# Patient Record
Sex: Female | Born: 1960 | Race: White | Hispanic: No | Marital: Married | Smoking: Never smoker
Health system: Southern US, Community
[De-identification: ages and names within clinical notes are randomized; demographics above are authoritative.]

## PROBLEM LIST (undated history)

## (undated) DIAGNOSIS — Z9889 Other specified postprocedural states: Secondary | ICD-10-CM

## (undated) DIAGNOSIS — R112 Nausea with vomiting, unspecified: Secondary | ICD-10-CM

## (undated) DIAGNOSIS — T4145XA Adverse effect of unspecified anesthetic, initial encounter: Secondary | ICD-10-CM

## (undated) DIAGNOSIS — T8859XA Other complications of anesthesia, initial encounter: Secondary | ICD-10-CM

## (undated) DIAGNOSIS — I10 Essential (primary) hypertension: Secondary | ICD-10-CM

## (undated) DIAGNOSIS — C801 Malignant (primary) neoplasm, unspecified: Secondary | ICD-10-CM

## (undated) DIAGNOSIS — Z803 Family history of malignant neoplasm of breast: Secondary | ICD-10-CM

## (undated) HISTORY — PX: HERNIA REPAIR: SHX51

## (undated) HISTORY — PX: OTHER SURGICAL HISTORY: SHX169

## (undated) HISTORY — PX: BREAST BIOPSY: SHX20

## (undated) HISTORY — PX: ROTATOR CUFF REPAIR: SHX139

## (undated) HISTORY — DX: Family history of malignant neoplasm of breast: Z80.3

---

## 1998-05-11 ENCOUNTER — Other Ambulatory Visit: Admission: RE | Admit: 1998-05-11 | Discharge: 1998-05-11 | Payer: Self-pay | Admitting: Obstetrics & Gynecology

## 1999-06-02 ENCOUNTER — Other Ambulatory Visit: Admission: RE | Admit: 1999-06-02 | Discharge: 1999-06-02 | Payer: Self-pay | Admitting: Obstetrics & Gynecology

## 1999-12-06 ENCOUNTER — Ambulatory Visit (HOSPITAL_COMMUNITY): Admission: RE | Admit: 1999-12-06 | Discharge: 1999-12-06 | Payer: Self-pay | Admitting: Obstetrics & Gynecology

## 1999-12-06 ENCOUNTER — Encounter: Payer: Self-pay | Admitting: Obstetrics & Gynecology

## 1999-12-08 ENCOUNTER — Encounter: Payer: Self-pay | Admitting: Obstetrics & Gynecology

## 1999-12-08 ENCOUNTER — Encounter: Admission: RE | Admit: 1999-12-08 | Discharge: 1999-12-08 | Payer: Self-pay | Admitting: Obstetrics & Gynecology

## 2000-06-03 ENCOUNTER — Other Ambulatory Visit: Admission: RE | Admit: 2000-06-03 | Discharge: 2000-06-03 | Payer: Self-pay | Admitting: Obstetrics & Gynecology

## 2000-12-09 ENCOUNTER — Encounter: Admission: RE | Admit: 2000-12-09 | Discharge: 2000-12-09 | Payer: Self-pay | Admitting: Family Medicine

## 2000-12-09 ENCOUNTER — Encounter: Payer: Self-pay | Admitting: Obstetrics & Gynecology

## 2001-06-12 ENCOUNTER — Other Ambulatory Visit: Admission: RE | Admit: 2001-06-12 | Discharge: 2001-06-12 | Payer: Self-pay | Admitting: Obstetrics & Gynecology

## 2001-12-10 ENCOUNTER — Encounter: Payer: Self-pay | Admitting: Obstetrics & Gynecology

## 2001-12-10 ENCOUNTER — Encounter: Admission: RE | Admit: 2001-12-10 | Discharge: 2001-12-10 | Payer: Self-pay | Admitting: Obstetrics & Gynecology

## 2002-07-10 ENCOUNTER — Other Ambulatory Visit: Admission: RE | Admit: 2002-07-10 | Discharge: 2002-07-10 | Payer: Self-pay | Admitting: Obstetrics & Gynecology

## 2002-12-21 ENCOUNTER — Encounter: Admission: RE | Admit: 2002-12-21 | Discharge: 2002-12-21 | Payer: Self-pay | Admitting: Obstetrics & Gynecology

## 2003-08-04 ENCOUNTER — Other Ambulatory Visit: Admission: RE | Admit: 2003-08-04 | Discharge: 2003-08-04 | Payer: Self-pay | Admitting: Obstetrics & Gynecology

## 2004-01-20 ENCOUNTER — Encounter: Admission: RE | Admit: 2004-01-20 | Discharge: 2004-01-20 | Payer: Self-pay | Admitting: Obstetrics & Gynecology

## 2004-01-28 ENCOUNTER — Encounter: Admission: RE | Admit: 2004-01-28 | Discharge: 2004-01-28 | Payer: Self-pay | Admitting: Obstetrics & Gynecology

## 2004-01-31 ENCOUNTER — Encounter (INDEPENDENT_AMBULATORY_CARE_PROVIDER_SITE_OTHER): Payer: Self-pay | Admitting: *Deleted

## 2004-01-31 ENCOUNTER — Encounter: Admission: RE | Admit: 2004-01-31 | Discharge: 2004-01-31 | Payer: Self-pay | Admitting: Obstetrics & Gynecology

## 2004-08-17 ENCOUNTER — Encounter: Admission: RE | Admit: 2004-08-17 | Discharge: 2004-08-17 | Payer: Self-pay | Admitting: Obstetrics & Gynecology

## 2004-09-19 ENCOUNTER — Other Ambulatory Visit: Admission: RE | Admit: 2004-09-19 | Discharge: 2004-09-19 | Payer: Self-pay | Admitting: Obstetrics & Gynecology

## 2005-03-07 ENCOUNTER — Encounter: Admission: RE | Admit: 2005-03-07 | Discharge: 2005-03-07 | Payer: Self-pay | Admitting: Obstetrics & Gynecology

## 2006-03-27 ENCOUNTER — Encounter: Admission: RE | Admit: 2006-03-27 | Discharge: 2006-03-27 | Payer: Self-pay | Admitting: Obstetrics & Gynecology

## 2007-04-08 ENCOUNTER — Encounter: Admission: RE | Admit: 2007-04-08 | Discharge: 2007-04-08 | Payer: Self-pay | Admitting: Obstetrics & Gynecology

## 2008-04-08 ENCOUNTER — Encounter: Admission: RE | Admit: 2008-04-08 | Discharge: 2008-04-08 | Payer: Self-pay | Admitting: Obstetrics & Gynecology

## 2008-06-16 ENCOUNTER — Ambulatory Visit: Payer: Self-pay | Admitting: Family Medicine

## 2009-04-12 ENCOUNTER — Encounter: Admission: RE | Admit: 2009-04-12 | Discharge: 2009-04-12 | Payer: Self-pay | Admitting: Obstetrics & Gynecology

## 2009-04-19 ENCOUNTER — Encounter: Admission: RE | Admit: 2009-04-19 | Discharge: 2009-04-19 | Payer: Self-pay | Admitting: Obstetrics & Gynecology

## 2010-03-05 ENCOUNTER — Encounter: Payer: Self-pay | Admitting: Obstetrics & Gynecology

## 2010-05-01 ENCOUNTER — Other Ambulatory Visit: Payer: Self-pay | Admitting: Obstetrics & Gynecology

## 2010-05-01 DIAGNOSIS — Z1231 Encounter for screening mammogram for malignant neoplasm of breast: Secondary | ICD-10-CM

## 2010-05-09 ENCOUNTER — Ambulatory Visit
Admission: RE | Admit: 2010-05-09 | Discharge: 2010-05-09 | Disposition: A | Discharge status: Home / Self Care | Payer: BC Managed Care – PPO | Source: Ambulatory Visit | Attending: Obstetrics & Gynecology | Admitting: Obstetrics & Gynecology

## 2010-05-09 DIAGNOSIS — Z1231 Encounter for screening mammogram for malignant neoplasm of breast: Secondary | ICD-10-CM

## 2011-04-30 ENCOUNTER — Other Ambulatory Visit: Payer: Self-pay | Admitting: Obstetrics & Gynecology

## 2011-04-30 DIAGNOSIS — Z1231 Encounter for screening mammogram for malignant neoplasm of breast: Secondary | ICD-10-CM

## 2011-05-21 ENCOUNTER — Ambulatory Visit: Payer: BC Managed Care – PPO

## 2011-07-06 ENCOUNTER — Ambulatory Visit
Admission: RE | Admit: 2011-07-06 | Discharge: 2011-07-06 | Disposition: A | Discharge status: Home / Self Care | Payer: BC Managed Care – PPO | Source: Ambulatory Visit | Attending: Obstetrics & Gynecology | Admitting: Obstetrics & Gynecology

## 2011-07-06 DIAGNOSIS — Z1231 Encounter for screening mammogram for malignant neoplasm of breast: Secondary | ICD-10-CM

## 2011-07-16 ENCOUNTER — Other Ambulatory Visit: Payer: Self-pay | Admitting: Obstetrics & Gynecology

## 2011-07-16 DIAGNOSIS — R928 Other abnormal and inconclusive findings on diagnostic imaging of breast: Secondary | ICD-10-CM

## 2011-07-23 ENCOUNTER — Ambulatory Visit
Admission: RE | Admit: 2011-07-23 | Discharge: 2011-07-23 | Disposition: A | Discharge status: Home / Self Care | Payer: BC Managed Care – PPO | Source: Ambulatory Visit | Attending: Obstetrics & Gynecology | Admitting: Obstetrics & Gynecology

## 2011-07-23 DIAGNOSIS — R928 Other abnormal and inconclusive findings on diagnostic imaging of breast: Secondary | ICD-10-CM

## 2011-07-24 ENCOUNTER — Other Ambulatory Visit: Payer: BC Managed Care – PPO

## 2011-11-27 ENCOUNTER — Other Ambulatory Visit: Payer: Self-pay | Admitting: General Surgery

## 2011-11-27 DIAGNOSIS — M25539 Pain in unspecified wrist: Secondary | ICD-10-CM

## 2011-11-29 ENCOUNTER — Ambulatory Visit
Admission: RE | Admit: 2011-11-29 | Discharge: 2011-11-29 | Disposition: A | Discharge status: Home / Self Care | Payer: BC Managed Care – PPO | Source: Ambulatory Visit | Attending: General Surgery | Admitting: General Surgery

## 2011-11-29 DIAGNOSIS — M25539 Pain in unspecified wrist: Secondary | ICD-10-CM

## 2012-07-17 ENCOUNTER — Other Ambulatory Visit: Payer: Self-pay

## 2012-07-17 DIAGNOSIS — Z1231 Encounter for screening mammogram for malignant neoplasm of breast: Secondary | ICD-10-CM

## 2012-09-01 ENCOUNTER — Ambulatory Visit
Admission: RE | Admit: 2012-09-01 | Discharge: 2012-09-01 | Disposition: A | Discharge status: Home / Self Care | Payer: BC Managed Care – PPO | Source: Ambulatory Visit

## 2012-09-01 DIAGNOSIS — Z1231 Encounter for screening mammogram for malignant neoplasm of breast: Secondary | ICD-10-CM

## 2012-09-04 ENCOUNTER — Other Ambulatory Visit: Payer: Self-pay | Admitting: Obstetrics & Gynecology

## 2012-09-04 DIAGNOSIS — Z803 Family history of malignant neoplasm of breast: Secondary | ICD-10-CM

## 2013-08-04 ENCOUNTER — Other Ambulatory Visit: Payer: Self-pay

## 2013-08-04 DIAGNOSIS — Z1231 Encounter for screening mammogram for malignant neoplasm of breast: Secondary | ICD-10-CM

## 2013-09-07 ENCOUNTER — Encounter (INDEPENDENT_AMBULATORY_CARE_PROVIDER_SITE_OTHER): Payer: Self-pay

## 2013-09-07 ENCOUNTER — Ambulatory Visit
Admission: RE | Admit: 2013-09-07 | Discharge: 2013-09-07 | Disposition: A | Discharge status: Home / Self Care | Payer: BC Managed Care – PPO | Source: Ambulatory Visit

## 2013-09-07 DIAGNOSIS — Z1231 Encounter for screening mammogram for malignant neoplasm of breast: Secondary | ICD-10-CM

## 2013-09-08 ENCOUNTER — Other Ambulatory Visit: Payer: Self-pay | Admitting: Obstetrics & Gynecology

## 2013-09-08 DIAGNOSIS — R928 Other abnormal and inconclusive findings on diagnostic imaging of breast: Secondary | ICD-10-CM

## 2013-09-14 ENCOUNTER — Ambulatory Visit
Admission: RE | Admit: 2013-09-14 | Discharge: 2013-09-14 | Disposition: A | Discharge status: Home / Self Care | Payer: BC Managed Care – PPO | Source: Ambulatory Visit | Attending: Obstetrics & Gynecology | Admitting: Obstetrics & Gynecology

## 2013-09-14 DIAGNOSIS — R928 Other abnormal and inconclusive findings on diagnostic imaging of breast: Secondary | ICD-10-CM

## 2014-07-28 ENCOUNTER — Other Ambulatory Visit: Payer: Self-pay | Admitting: Obstetrics & Gynecology

## 2014-07-29 LAB — CYTOLOGY - PAP

## 2014-09-13 ENCOUNTER — Other Ambulatory Visit: Payer: Self-pay

## 2014-09-13 DIAGNOSIS — Z1231 Encounter for screening mammogram for malignant neoplasm of breast: Secondary | ICD-10-CM

## 2014-10-20 ENCOUNTER — Ambulatory Visit: Payer: Self-pay

## 2014-10-26 ENCOUNTER — Ambulatory Visit: Payer: Self-pay

## 2014-11-01 ENCOUNTER — Ambulatory Visit
Admission: RE | Admit: 2014-11-01 | Discharge: 2014-11-01 | Disposition: A | Discharge status: Home / Self Care | Payer: BLUE CROSS/BLUE SHIELD | Source: Ambulatory Visit

## 2014-11-01 DIAGNOSIS — Z1231 Encounter for screening mammogram for malignant neoplasm of breast: Secondary | ICD-10-CM

## 2015-11-14 ENCOUNTER — Other Ambulatory Visit: Payer: Self-pay | Admitting: Obstetrics & Gynecology

## 2015-11-14 DIAGNOSIS — Z1231 Encounter for screening mammogram for malignant neoplasm of breast: Secondary | ICD-10-CM

## 2015-11-17 ENCOUNTER — Ambulatory Visit
Admission: RE | Admit: 2015-11-17 | Discharge: 2015-11-17 | Disposition: A | Discharge status: Home / Self Care | Payer: BLUE CROSS/BLUE SHIELD | Source: Ambulatory Visit | Attending: Obstetrics & Gynecology | Admitting: Obstetrics & Gynecology

## 2015-11-17 DIAGNOSIS — Z1231 Encounter for screening mammogram for malignant neoplasm of breast: Secondary | ICD-10-CM

## 2016-11-19 ENCOUNTER — Other Ambulatory Visit: Payer: Self-pay | Admitting: Obstetrics & Gynecology

## 2016-11-19 DIAGNOSIS — Z1231 Encounter for screening mammogram for malignant neoplasm of breast: Secondary | ICD-10-CM

## 2016-12-07 ENCOUNTER — Ambulatory Visit
Admission: RE | Admit: 2016-12-07 | Discharge: 2016-12-07 | Disposition: A | Discharge status: Home / Self Care | Payer: 59 | Source: Ambulatory Visit | Attending: Obstetrics & Gynecology | Admitting: Obstetrics & Gynecology

## 2016-12-07 DIAGNOSIS — Z1231 Encounter for screening mammogram for malignant neoplasm of breast: Secondary | ICD-10-CM

## 2017-04-15 ENCOUNTER — Other Ambulatory Visit: Payer: Self-pay | Admitting: Otolaryngology

## 2017-04-15 DIAGNOSIS — H8102 Meniere's disease, left ear: Secondary | ICD-10-CM

## 2017-04-15 DIAGNOSIS — H9313 Tinnitus, bilateral: Secondary | ICD-10-CM

## 2017-04-15 DIAGNOSIS — H9122 Sudden idiopathic hearing loss, left ear: Secondary | ICD-10-CM

## 2017-04-23 ENCOUNTER — Ambulatory Visit
Admission: RE | Admit: 2017-04-23 | Discharge: 2017-04-23 | Disposition: A | Discharge status: Home / Self Care | Payer: 59 | Source: Ambulatory Visit | Attending: Otolaryngology | Admitting: Otolaryngology

## 2017-04-23 DIAGNOSIS — H9313 Tinnitus, bilateral: Secondary | ICD-10-CM

## 2017-04-23 DIAGNOSIS — H9122 Sudden idiopathic hearing loss, left ear: Secondary | ICD-10-CM

## 2017-04-23 DIAGNOSIS — H8102 Meniere's disease, left ear: Secondary | ICD-10-CM

## 2017-04-23 MED ORDER — GADOBENATE DIMEGLUMINE 529 MG/ML IV SOLN
10.0000 mL | Freq: Once | INTRAVENOUS | Status: AC | PRN
  Administered 2017-04-23: 10 mL via INTRAVENOUS

## 2017-12-02 ENCOUNTER — Other Ambulatory Visit: Payer: Self-pay | Admitting: Obstetrics & Gynecology

## 2017-12-02 DIAGNOSIS — Z1231 Encounter for screening mammogram for malignant neoplasm of breast: Secondary | ICD-10-CM

## 2018-01-13 ENCOUNTER — Ambulatory Visit
Admission: RE | Admit: 2018-01-13 | Discharge: 2018-01-13 | Disposition: A | Discharge status: Home / Self Care | Payer: 59 | Source: Ambulatory Visit | Attending: Obstetrics & Gynecology | Admitting: Obstetrics & Gynecology

## 2018-01-13 DIAGNOSIS — Z1231 Encounter for screening mammogram for malignant neoplasm of breast: Secondary | ICD-10-CM

## 2018-05-02 ENCOUNTER — Telehealth: Payer: Self-pay

## 2018-05-02 ENCOUNTER — Inpatient Hospital Stay: Payer: 59 | Attending: Gynecologic Oncology

## 2018-05-02 ENCOUNTER — Inpatient Hospital Stay (HOSPITAL_BASED_OUTPATIENT_CLINIC_OR_DEPARTMENT_OTHER): Payer: 59 | Admitting: Gynecologic Oncology

## 2018-05-02 ENCOUNTER — Other Ambulatory Visit: Payer: Self-pay

## 2018-05-02 ENCOUNTER — Encounter: Payer: Self-pay | Admitting: Gynecologic Oncology

## 2018-05-02 VITALS — BP 148/88 | HR 100 | Resp 20 | Ht 62.5 in | Wt 124.0 lb

## 2018-05-02 DIAGNOSIS — D3912 Neoplasm of uncertain behavior of left ovary: Secondary | ICD-10-CM | POA: Insufficient documentation

## 2018-05-02 DIAGNOSIS — I10 Essential (primary) hypertension: Secondary | ICD-10-CM | POA: Insufficient documentation

## 2018-05-02 DIAGNOSIS — Z79899 Other long term (current) drug therapy: Secondary | ICD-10-CM | POA: Diagnosis not present

## 2018-05-02 DIAGNOSIS — R971 Elevated cancer antigen 125 [CA 125]: Secondary | ICD-10-CM

## 2018-05-02 DIAGNOSIS — N838 Other noninflammatory disorders of ovary, fallopian tube and broad ligament: Secondary | ICD-10-CM

## 2018-05-02 LAB — BASIC METABOLIC PANEL
Anion gap: 13 (ref 5–15)
BUN: 9 mg/dL (ref 6–20)
CHLORIDE: 96 mmol/L — AB (ref 98–111)
CO2: 24 mmol/L (ref 22–32)
CREATININE: 0.83 mg/dL (ref 0.44–1.00)
Calcium: 9.4 mg/dL (ref 8.9–10.3)
GFR calc Af Amer: >60 mL/min (ref >60)
GFR calc non Af Amer: >60 mL/min (ref >60)
GLUCOSE: 88 mg/dL (ref 70–99)
POTASSIUM: 4 mmol/L (ref 3.5–5.1)
Sodium: 133 mmol/L — ABNORMAL LOW (ref 135–145)

## 2018-05-02 NOTE — Telephone Encounter (Signed)
LM for patient stating that her sodium level was slightly low at 133.  She can take in extra sodium with table salt and or eat canned soups. Her kidney function is normal but edging up toward the high normal; range.  This maybe showing that she is a little dehydrated.  Increase non caffinated  fluids to 64 ounces a day.   This is a good amount for staying hydrated daily. Pt can call on 05-05-18 if she has any questions or concerns.

## 2018-05-02 NOTE — Patient Instructions (Addendum)
Plan to have a CT scan of the abdomen and pelvis and we will contact you with the results.   You will be contacted with a surgical date when it is considered safe to perform surgeries. We will also arrange for you to meet with Joylene John, NP for a pre-op appointment prior to surgery to discuss instructions in detail.                 Preparing for your Surgery  Plan for surgery with Dr. Everitt Amber at [facility] will be scheduled for a robotic assisted unilateral salpingo-oophorectomy, possible staging, possible total hysterectomy.   Pre-operative Testing -You will receive a phone call from presurgical testing at [facility] to arrange for a pre-operative testing appointment before your surgery.  This appointment normally occurs one to two weeks before your scheduled surgery.   -Bring your insurance card, copy of an advanced directive if applicable, medication list  -At that visit, you will be asked to sign a consent for a possible blood transfusion in case a transfusion becomes necessary during surgery.  The need for a blood transfusion is rare but having consent is a necessary part of your care.     -You should not be taking blood thinners or aspirin at least ten days prior to surgery unless instructed by your surgeon.  Day Before Surgery at [place] [facility] will be asked to take in a light diet the day before surgery.  Avoid carbonated beverages.  You will be advised to have nothing to eat or drink after midnight the evening before.    Eat a light diet the day before surgery.  Examples including soups, broths, toast, yogurt, mashed potatoes.  Things to avoid include carbonated beverages (fizzy beverages), raw fruits and raw vegetables, or beans.   If your bowels are filled with gas, your surgeon will have difficulty visualizing your pelvic organs which increases your surgical risks.  Your role in recovery Your role is to become active as soon as directed by your doctor,  while still giving yourself time to heal.  Rest when you feel tired. You will be asked to do the following in order to speed your recovery:  - Cough and breathe deeply. This helps toclear and expand your lungs and can prevent pneumonia. You may be given a spirometer to practice deep breathing. A staff member will show you how to use the spirometer. - Do mild physical activity. Walking or moving your legs help your circulation and body functions return to normal. A staff member will help you when you try to walk and will provide you with simple exercises. Do not try to get up or walk alone the first time. - Actively manage your pain. Managing your pain lets you move in comfort. We will ask you to rate your pain on a scale of zero to 10. It is your responsibility to tell your doctor or nurse where and how much you hurt so your pain can be treated.  Special Considerations -If you are diabetic, you may be placed on insulin after surgery to have closer control over your blood sugars to promote healing and recovery.  This does not mean that you will be discharged on insulin.  If applicable, your oral antidiabetics will be resumed when you are tolerating a solid diet.  -Your final pathology results from surgery should be available around one week after surgery and the results will be relayed to you when available.  -Dr. Lahoma Crocker  is the Surgeon that assists your GYN Oncologist with surgery.    -FMLA forms can be faxed to [phone] and please allow 5-7 business days for completion.   Blood Transfusion Information WHAT IS A BLOOD TRANSFUSION? A transfusion is the replacement of blood or some of its parts. Blood is made up of multiple cells which provide different functions.  Red blood cells carry oxygen and are used for blood loss replacement.  White blood cells fight against infection.  Platelets control bleeding.  Plasma helps clot blood.  Other blood products are available for  specialized needs, such as hemophilia or other clotting disorders. BEFORE THE TRANSFUSION  Who gives blood for transfusions?   You may be able to donate blood to be used at a later date on yourself (autologous donation).  Relatives can be asked to donate blood. This is generally not any safer than if you have received blood from a stranger. The same precautions are taken to ensure safety when a relative's blood is donated.  Healthy volunteers who are fully evaluated to make sure their blood is safe. This is blood bank blood. Transfusion therapy is the safest it has ever been in the practice of medicine. Before blood is taken from a donor, a complete history is taken to make sure that person has no history of diseases nor engages in risky social behavior (examples are intravenous drug use or sexual activity with multiple partners). The donor's travel history is screened to minimize risk of transmitting infections, such as malaria. The donated blood is tested for signs of infectious diseases, such as HIV and hepatitis. The blood is then tested to be sure it is compatible with you in order to minimize the chance of a transfusion reaction. If you or a relative donates blood, this is often done in anticipation of surgery and is not appropriate for emergency situations. It takes many days to process the donated blood. RISKS AND COMPLICATIONS Although transfusion therapy is very safe and saves many lives, the main dangers of transfusion include:   Getting an infectious disease.  Developing a transfusion reaction. This is an allergic reaction to something in the blood you were given. Every precaution is taken to prevent this. The decision to have a blood transfusion has been considered carefully by your caregiver before blood is given. Blood is not given unless the benefits outweigh the risks.

## 2018-05-02 NOTE — Progress Notes (Signed)
Consult Note: Gyn-Onc  Consult was requested by Dr. Nori Riis for the evaluation of Sara Werner 58 y.o. female  CC:  Chief Complaint  Patient presents with  . Ovarian mass, left    Assessment/Plan:  Sara Werner  is a 58 y.o.  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  On physical examination findings are reassuring, however I certainly do feel a large cystic mass filling the cul-de-sac.  The patient is relatively asymptomatic.  We will first begin by better evaluating the mass in the upper abdomen with a CT abdomen and pelvis.  Once this is been performed we will plan on scheduling the patient for surgery at the appropriate timing interval pending resource availability.  I explained that surgery will involve a robotically assisted unilateral salpingo-oophorectomy possible BSO hysterectomy and staging pending frozen section results.  I discussed anticipated recovery, hospital stay, and risks including  bleeding, infection, damage to internal organs (such as bladder,ureters, bowels), blood clot, reoperation and rehospitalization.   HPI: Sara Werner is a 58 year old woman who is seen in consultation at the request of Dr Nori Riis for a left ovarian mass and elevated CA 125.  The patient had an episode of straining to have a bowel movement approximately 1 week ago in March 2020.  She then felt some pelvic pressure.  She returned to [facility] and reported this to her OB/GYN GYN who performed a transvaginal ultrasound scan on May 01, 2018.  This revealed a uterus measuring 8 x 4 x 5 cm with an endometrium of 6 mm.  The right ovary was not visualized.  The left adnexa revealed a 10.6 x 6.5 x 9.8 cm complex mass seen.  It was partly cystic and partly solid.  There is some blood flow seen within the solid areas.  No normal ovarian tissue was seen.  A Ca1 25 was drawn on May 02, 2018 and this was mildly elevated at 56.1 (upper limit of normal 38.1).  Patient is  an otherwise very healthy woman.  Her she is never had abdominal surgeries.  She has had a history of 2 prior vaginal deliveries.  She has no family history for breast ovarian or colonic cancer.  She is a non-smoker.  Current Meds:  Outpatient Encounter Medications as of 05/02/2018  Medication Sig  . chlorthalidone (HYGROTON) 25 MG tablet Take 25 mg by mouth daily.  Marland Kitchen ESTROGEL 0.75 MG/1.25 GM (0.06%) topical gel APP 2 PUMPS TOPICALLY AA D  . lisdexamfetamine (VYVANSE) 70 MG capsule Take 70 mg by mouth daily.  Marland Kitchen lisinopril-hydrochlorothiazide (PRINZIDE,ZESTORETIC) 20-12.5 MG tablet TK 1 T PO D  . progesterone (PROMETRIUM) 100 MG capsule Take 100 mg by mouth at bedtime.   No facility-administered encounter medications on file as of 05/02/2018.     Allergy: No Known Allergies  Social Hx:   Social History   Socioeconomic History  . Marital status: Married    Spouse name: Not on file  . Number of children: Not on file  . Years of education: Not on file  . Highest education level: Not on file  Occupational History  . Not on file  Social Needs  . Financial resource strain: Not on file  . Food insecurity:    Worry: Not on file    Inability: Not on file  . Transportation needs:    Medical: Not on file    Non-medical: Not on file  Tobacco Use  . Smoking status:  Never Smoker  . Smokeless tobacco: Never Used  Substance and Sexual Activity  . Alcohol use: Yes    Comment: Daily drinks  . Drug use: Never  . Sexual activity: Not Currently  Lifestyle  . Physical activity:    Days per week: Not on file    Minutes per session: Not on file  . Stress: Not on file  Relationships  . Social connections:    Talks on phone: Not on file    Gets together: Not on file    Attends religious service: Not on file    Active member of club or organization: Not on file    Attends meetings of clubs or organizations: Not on file    Relationship status: Not on file  . Intimate partner violence:     Fear of current or ex partner: Not on file    Emotionally abused: Not on file    Physically abused: Not on file    Forced sexual activity: Not on file  Other Topics Concern  . Not on file  Social History Narrative  . Not on file    Past Surgical Hx:  Past Surgical History:  Procedure Laterality Date  . BREAST BIOPSY Right    negative    Past Medical Hx: History reviewed. No pertinent past medical history.  Past Gynecological History:  SVD x 2 Patient's last menstrual period was 04/23/2010.  Family Hx:  Family History  Problem Relation Age of Onset  . Breast cancer Mother   . Alzheimer's disease Mother     Review of Systems:  Constitutional  Feels well,    ENT Normal appearing ears and nares bilaterally Skin/Breast  No rash, sores, jaundice, itching, dryness Cardiovascular  No chest pain, shortness of breath, or edema  Pulmonary  No cough or wheeze.  Gastro Intestinal  No nausea, vomitting, or diarrhoea. No bright red blood per rectum, no abdominal pain, change in bowel movement, or constipation.  Genito Urinary  No frequency, urgency, dysuria, no bleeding, + pelvic pressure Musculo Skeletal  No myalgia, arthralgia, joint swelling or pain  Neurologic  No weakness, numbness, change in gait,  Psychology  No depression, anxiety, insomnia.   Vitals:  Blood pressure (!) 148/88, pulse 100, resp. rate 20, height 5' 2.5" (1.588 m), weight 124 lb (56.2 kg), last menstrual period 04/23/2010, SpO2 100 %.  Physical Exam: WD in NAD Neck  Supple NROM, without any enlargements.  Lymph Node Survey No cervical supraclavicular or inguinal adenopathy Cardiovascular  Pulse normal rate, regularity and rhythm. S1 and S2 normal.  Lungs  Clear to auscultation bilateraly, without wheezes/crackles/rhonchi. Good air movement.  Skin  No rash/lesions/breakdown  Psychiatry  Alert and oriented to person, place, and time  Abdomen  Normoactive bowel sounds, abdomen soft, non-tender  and thin without evidence of hernia.  Back No CVA tenderness Genito Urinary  Vulva/vagina: Normal external female genitalia.  No lesions. No discharge or bleeding.  Bladder/urethra:  No lesions or masses, well supported bladder  Vagina: normal  Cervix: Normal appearing, no lesions.  Uterus:  Small, mobile, no parametrial involvement or nodularity.  Adnexa: fullness in the left adnexa and smooth cystic mass in cul de sac.   Rectal  Good tone,  no cul de sac nodularity.  Extremities  No bilateral cyanosis, clubbing or edema.   Thereasa Solo, MD  05/02/2018, 5:08 PM

## 2018-05-02 NOTE — H&P (View-Only) (Signed)
Consult Note: Gyn-Onc  Consult was requested by Dr. Nori Riis for the evaluation of Sara Werner 58 y.o. female  CC:  Chief Complaint  Patient presents with  . Ovarian mass, left    Assessment/Plan:  [name]  is a 58 y.o.  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  On physical examination findings are reassuring, however I certainly do feel a large cystic mass filling the cul-de-sac.  The patient is relatively asymptomatic.  We will first begin by better evaluating the mass in the upper abdomen with a CT abdomen and pelvis.  Once this is been performed we will plan on scheduling the patient for surgery at the appropriate timing interval pending resource availability.  I explained that surgery will involve a robotically assisted unilateral salpingo-oophorectomy possible BSO hysterectomy and staging pending frozen section results.  I discussed anticipated recovery, hospital stay, and risks including  bleeding, infection, damage to internal organs (such as bladder,ureters, bowels), blood clot, reoperation and rehospitalization.   HPI: [name] is a 58 year old woman who is seen in consultation at the request of Dr Nori Riis for a left ovarian mass and elevated CA 125.  The patient had an episode of straining to have a bowel movement approximately 1 week ago in March 2020.  She then felt some pelvic pressure.  She returned to [facility] and reported this to her OB/GYN GYN who performed a transvaginal ultrasound scan on May 01, 2018.  This revealed a uterus measuring 8 x 4 x 5 cm with an endometrium of 6 mm.  The right ovary was not visualized.  The left adnexa revealed a 10.6 x 6.5 x 9.8 cm complex mass seen.  It was partly cystic and partly solid.  There is some blood flow seen within the solid areas.  No normal ovarian tissue was seen.  A Ca1 25 was drawn on May 02, 2018 and this was mildly elevated at 56.1 (upper limit of normal 38.1).  Patient is  an otherwise very healthy woman.  Her she is never had abdominal surgeries.  She has had a history of 2 prior vaginal deliveries.  She has no family history for breast ovarian or colonic cancer.  She is a non-smoker.  Current Meds:  Outpatient Encounter Medications as of 05/02/2018  Medication Sig  . chlorthalidone (HYGROTON) 25 MG tablet Take 25 mg by mouth daily.  Marland Kitchen ESTROGEL 0.75 MG/1.25 GM (0.06%) topical gel APP 2 PUMPS TOPICALLY AA D  . lisdexamfetamine (VYVANSE) 70 MG capsule Take 70 mg by mouth daily.  Marland Kitchen lisinopril-hydrochlorothiazide (PRINZIDE,ZESTORETIC) 20-12.5 MG tablet TK 1 T PO D  . progesterone (PROMETRIUM) 100 MG capsule Take 100 mg by mouth at bedtime.   No facility-administered encounter medications on file as of 05/02/2018.     Allergy: No Known Allergies  Social Hx:   Social History   Socioeconomic History  . Marital status: Married    Spouse name: Not on file  . Number of children: Not on file  . Years of education: Not on file  . Highest education level: Not on file  Occupational History  . Not on file  Social Needs  . Financial resource strain: Not on file  . Food insecurity:    Worry: Not on file    Inability: Not on file  . Transportation needs:    Medical: Not on file    Non-medical: Not on file  Tobacco Use  . Smoking status:  Never Smoker  . Smokeless tobacco: Never Used  Substance and Sexual Activity  . Alcohol use: Yes    Comment: Daily drinks  . Drug use: Never  . Sexual activity: Not Currently  Lifestyle  . Physical activity:    Days per week: Not on file    Minutes per session: Not on file  . Stress: Not on file  Relationships  . Social connections:    Talks on phone: Not on file    Gets together: Not on file    Attends religious service: Not on file    Active member of club or organization: Not on file    Attends meetings of clubs or organizations: Not on file    Relationship status: Not on file  . Intimate partner violence:     Fear of current or ex partner: Not on file    Emotionally abused: Not on file    Physically abused: Not on file    Forced sexual activity: Not on file  Other Topics Concern  . Not on file  Social History Narrative  . Not on file    Past Surgical Hx:  Past Surgical History:  Procedure Laterality Date  . BREAST BIOPSY Right    negative    Past Medical Hx: History reviewed. No pertinent past medical history.  Past Gynecological History:  SVD x 2 Patient's last menstrual period was 04/23/2010.  Family Hx:  Family History  Problem Relation Age of Onset  . Breast cancer Mother   . Alzheimer's disease Mother     Review of Systems:  Constitutional  Feels well,    ENT Normal appearing ears and nares bilaterally Skin/Breast  No rash, sores, jaundice, itching, dryness Cardiovascular  No chest pain, shortness of breath, or edema  Pulmonary  No cough or wheeze.  Gastro Intestinal  No nausea, vomitting, or diarrhoea. No bright red blood per rectum, no abdominal pain, change in bowel movement, or constipation.  Genito Urinary  No frequency, urgency, dysuria, no bleeding, + pelvic pressure Musculo Skeletal  No myalgia, arthralgia, joint swelling or pain  Neurologic  No weakness, numbness, change in gait,  Psychology  No depression, anxiety, insomnia.   Vitals:  Blood pressure (!) 148/88, pulse 100, resp. rate 20, height 5' 2.5" (1.588 m), weight 124 lb (56.2 kg), last menstrual period 04/23/2010, SpO2 100 %.  Physical Exam: WD in NAD Neck  Supple NROM, without any enlargements.  Lymph Node Survey No cervical supraclavicular or inguinal adenopathy Cardiovascular  Pulse normal rate, regularity and rhythm. S1 and S2 normal.  Lungs  Clear to auscultation bilateraly, without wheezes/crackles/rhonchi. Good air movement.  Skin  No rash/lesions/breakdown  Psychiatry  Alert and oriented to person, place, and time  Abdomen  Normoactive bowel sounds, abdomen soft, non-tender  and thin without evidence of hernia.  Back No CVA tenderness Genito Urinary  Vulva/vagina: Normal external female genitalia.  No lesions. No discharge or bleeding.  Bladder/urethra:  No lesions or masses, well supported bladder  Vagina: normal  Cervix: Normal appearing, no lesions.  Uterus:  Small, mobile, no parametrial involvement or nodularity.  Adnexa: fullness in the left adnexa and smooth cystic mass in cul de sac.   Rectal  Good tone,  no cul de sac nodularity.  Extremities  No bilateral cyanosis, clubbing or edema.   Thereasa Solo, MD  05/02/2018, 5:08 PM

## 2018-05-05 ENCOUNTER — Other Ambulatory Visit: Payer: Self-pay

## 2018-05-05 ENCOUNTER — Ambulatory Visit (HOSPITAL_COMMUNITY)
Admission: RE | Admit: 2018-05-05 | Discharge: 2018-05-05 | Disposition: A | Discharge status: Home / Self Care | Payer: 59 | Source: Ambulatory Visit | Attending: Gynecologic Oncology | Admitting: Gynecologic Oncology

## 2018-05-05 DIAGNOSIS — N838 Other noninflammatory disorders of ovary, fallopian tube and broad ligament: Secondary | ICD-10-CM | POA: Diagnosis present

## 2018-05-05 MED ORDER — IOHEXOL 300 MG/ML  SOLN
100.0000 mL | Freq: Once | INTRAMUSCULAR | Status: AC | PRN
  Administered 2018-05-05: 100 mL via INTRAVENOUS

## 2018-05-05 MED ORDER — SODIUM CHLORIDE (PF) 0.9 % IJ SOLN
INTRAMUSCULAR | Status: AC
  Filled 2018-05-05: qty 50

## 2018-05-06 ENCOUNTER — Telehealth: Payer: Self-pay | Admitting: *Deleted

## 2018-05-06 ENCOUNTER — Telehealth: Payer: Self-pay | Admitting: Gynecologic Oncology

## 2018-05-06 ENCOUNTER — Other Ambulatory Visit: Payer: Self-pay | Admitting: Gynecologic Oncology

## 2018-05-06 DIAGNOSIS — N838 Other noninflammatory disorders of ovary, fallopian tube and broad ligament: Secondary | ICD-10-CM

## 2018-05-06 NOTE — Telephone Encounter (Signed)
Patient's husband called stating "I wanted to thank Dr. Denman George for getting my wife's surgery scheduled so soon. Also to talk to about the CT results." Explained that Dr. Denman George will be in the office tomorrow. Husband can be reached at [phone].

## 2018-05-06 NOTE — Patient Instructions (Addendum)
KEELYN MONJARAS  05/06/2018   Your procedure is scheduled on: 05-08-18   Report to [facility] Main  Entrance    Report to [place] at 5:30 AM    Call this number if you have problems the morning of surgery [phone]    Eat a light diet the day before surgery.  Examples including soups, broths, toast, yogurt, mashed potatoes.  Things to avoid include carbonated beverages (fizzy beverages), raw fruits and raw vegetables, or beans.   If your bowels are filled with gas, your surgeon will have difficulty visualizing your pelvic organs which increases your surgical risks.   Remember: NO SOLID FOOD AFTER MIDNIGHT THE NIGHT PRIOR TO SURGERY. NOTHING BY MOUTH EXCEPT CLEAR LIQUIDS UNTIL 3 HOURS PRIOR TO [place] SURGERY. PLEASE FINISH ENSURE DRINK PER SURGEON ORDER 3 HOURS PRIOR TO SCHEDULED SURGERY TIME WHICH NEEDS TO BE COMPLETED AT 4:30 AM.    CLEAR LIQUID DIET   Foods Allowed                                                                     Foods Excluded  Coffee and tea, regular and decaf                             liquids that you cannot  Plain Jell-O in any flavor                                             see through such as: Fruit ices (not with fruit pulp)                                     milk, soups, orange juice  Iced Popsicles                                    All solid food Carbonated beverages, regular and diet                                    Cranberry, grape and apple juices Sports drinks like Gatorade Lightly seasoned clear broth or consume(fat free) Sugar, honey syrup  Sample Menu Breakfast                                Lunch                                     Supper Cranberry juice                    Beef broth  Chicken broth Jell-O                                     Grape juice                           Apple juice Coffee or tea                        Jell-O                                       Popsicle                                                Coffee or tea                        Coffee or tea  _____________________________________________________________________    BRUSH YOUR TEETH MORNING OF SURGERY AND RINSE YOUR MOUTH OUT, NO CHEWING GUM CANDY OR MINTS.     Take these medicines the morning of surgery with A SIP OF WATER: Cetirizine (Zyrtec)                                You may not have any metal on your body including hair pins and              piercings  Do not wear jewelry, make-up, lotions, powders or perfumes, deodorant             Do not wear nail polish.  Do not shave  48 hours prior to surgery.     Do not bring valuables to the hospital. [place].  Contacts, dentures or bridgework may not be worn into surgery.  Leave suitcase in the car. After surgery it may be brought to your room.     Patients discharged the day of surgery will not be allowed to drive home. IF YOU ARE HAVING SURGERY AND GOING HOME THE SAME DAY, YOU MUST HAVE AN ADULT TO DRIVE YOU HOME AND BE WITH YOU FOR 24 HOURS. YOU MAY GO HOME BY TAXI OR UBER OR ORTHERWISE, BUT AN ADULT MUST ACCOMPANY YOU HOME AND STAY WITH YOU FOR 24 HOURS.  Name and phone number of your driver:Dr. Lavaya Defreitas [phone]  Special Instructions: N/A              Please read over the following fact sheets you were given: _____________________________________________________________________  [facility] - Preparing for Surgery Before surgery, you can play an important role.  Because skin is not sterile, your skin needs to be as free of germs as possible.  You can reduce the number of germs on your skin by washing with CHG (chlorahexidine gluconate) soap before surgery.  CHG is an antiseptic cleaner which kills germs and bonds with the skin to continue killing germs even after washing. Please DO NOT use if you have an allergy to CHG or antibacterial soaps.  If your skin  becomes reddened/irritated stop using the CHG and inform your nurse when you arrive at Short Stay. Do not shave (including legs and underarms) for at least 48 hours prior to the first CHG shower.  You may shave your face/neck. Please follow these instructions carefully:  1.  Shower with CHG Soap the night before surgery and the  morning of Surgery.  2.  If you choose to wash your hair, wash your hair first as usual with your  normal  shampoo.  3.  After you shampoo, rinse your hair and body thoroughly to remove the  shampoo.                           4.  Use CHG as you would any other liquid soap.  You can apply chg directly  to the skin and wash                       Gently with a scrungie or clean washcloth.  5.  Apply the CHG Soap to your body ONLY FROM THE NECK DOWN.   Do not use on face/ open                           Wound or open sores. Avoid contact with eyes, ears mouth and genitals (private parts).                       Wash face,  Genitals (private parts) with your normal soap.             6.  Wash thoroughly, paying special attention to the area where your surgery  will be performed.  7.  Thoroughly rinse your body with warm water from the neck down.  8.  DO NOT shower/wash with your normal soap after using and rinsing off  the CHG Soap.                9.  Pat yourself dry with a clean towel.            10.  Wear clean pajamas.            11.  Place clean sheets on your bed the night of your first shower and do not  sleep with pets. Day of Surgery : Do not apply any lotions/deodorants the morning of surgery.  Please wear clean clothes to the hospital/surgery center.  FAILURE TO FOLLOW THESE INSTRUCTIONS MAY RESULT IN THE CANCELLATION OF YOUR SURGERY PATIENT SIGNATURE_________________________________  NURSE SIGNATURE__________________________________  ________________________________________________________________________   Adam Phenix  An incentive spirometer is a  tool that can help keep your lungs clear and active. This tool measures how well you are filling your lungs with each breath. Taking long deep breaths may help reverse or decrease the chance of developing breathing (pulmonary) problems (especially infection) following:  A long period of time when you are unable to move or be active. BEFORE THE PROCEDURE   If the spirometer includes an indicator to show your best effort, your nurse or respiratory therapist will set it to a desired goal.  If possible, sit up straight or lean slightly forward. Try not to slouch.  Hold the incentive spirometer in an upright position. INSTRUCTIONS FOR USE  1. Sit on the edge of your bed if possible, or sit up as far as you can in bed or on  a chair. 2. Hold the incentive spirometer in an upright position. 3. Breathe out normally. 4. Place the mouthpiece in your mouth and seal your lips tightly around it. 5. Breathe in slowly and as deeply as possible, raising the piston or the ball toward the top of the column. 6. Hold your breath for 3-5 seconds or for as long as possible. Allow the piston or ball to fall to the bottom of the column. 7. Remove the mouthpiece from your mouth and breathe out normally. 8. Rest for a few seconds and repeat Steps 1 through 7 at least 10 times every 1-2 hours when you are awake. Take your time and take a few normal breaths between deep breaths. 9. The spirometer may include an indicator to show your best effort. Use the indicator as a goal to work toward during each repetition. 10. After each set of 10 deep breaths, practice coughing to be sure your lungs are clear. If you have an incision (the cut made at the time of surgery), support your incision when coughing by placing a pillow or rolled up towels firmly against it. Once you are able to get out of bed, walk around indoors and cough well. You may stop using the incentive spirometer when instructed by your caregiver.  RISKS AND  COMPLICATIONS  Take your time so you do not get dizzy or light-headed.  If you are in pain, you may need to take or ask for pain medication before doing incentive spirometry. It is harder to take a deep breath if you are having pain. AFTER USE  Rest and breathe slowly and easily.  It can be helpful to keep track of a log of your progress. Your caregiver can provide you with a simple table to help with this. If you are using the spirometer at home, follow these instructions: [place] IF:   You are having difficultly using the spirometer.  You have trouble using the spirometer as often as instructed.  Your pain medication is not giving enough relief while using the spirometer.  You develop fever of 100.5 F (38.1 C) or higher. SEEK IMMEDIATE MEDICAL CARE IF:   You cough up bloody sputum that had not been present before.  You develop fever of 102 F (38.9 C) or greater.  You develop worsening pain at or near the incision site. MAKE SURE YOU:   Understand these instructions.  Will watch your condition.  Will get help right away if you are not doing well or get worse. Document Released: 06/11/2006 Document Revised: 04/23/2011 Document Reviewed: 08/12/2006 Exit[employer] Patient Information 2014 [employer], [place].   ________________________________________________________________________  WHAT IS A BLOOD TRANSFUSION? Blood Transfusion Information  A transfusion is the replacement of blood or some of its parts. Blood is made up of multiple cells which provide different functions.  Red blood cells carry oxygen and are used for blood loss replacement.  White blood cells fight against infection.  Platelets control bleeding.  Plasma helps clot blood.  Other blood products are available for specialized needs, such as hemophilia or other clotting disorders. BEFORE THE TRANSFUSION  Who gives blood for transfusions?   Healthy volunteers who are fully evaluated to make sure  their blood is safe. This is blood bank blood. Transfusion therapy is the safest it has ever been in the practice of medicine. Before blood is taken from a donor, a complete history is taken to make sure that person has no history of diseases nor engages in risky social behavior (  examples are intravenous drug use or sexual activity with multiple partners). The donor's travel history is screened to minimize risk of transmitting infections, such as malaria. The donated blood is tested for signs of infectious diseases, such as HIV and hepatitis. The blood is then tested to be sure it is compatible with you in order to minimize the chance of a transfusion reaction. If you or a relative donates blood, this is often done in anticipation of surgery and is not appropriate for emergency situations. It takes many days to process the donated blood. RISKS AND COMPLICATIONS Although transfusion therapy is very safe and saves many lives, the main dangers of transfusion include:   Getting an infectious disease.  Developing a transfusion reaction. This is an allergic reaction to something in the blood you were given. Every precaution is taken to prevent this. The decision to have a blood transfusion has been considered carefully by your caregiver before blood is given. Blood is not given unless the benefits outweigh the risks. AFTER THE TRANSFUSION  Right after receiving a blood transfusion, you will usually feel much better and more energetic. This is especially true if your red blood cells have gotten low (anemic). The transfusion raises the level of the red blood cells which carry oxygen, and this usually causes an energy increase.  The nurse administering the transfusion will monitor you carefully for complications. HOME CARE INSTRUCTIONS  No special instructions are needed after a transfusion. You may find your energy is better. Speak with your caregiver about any limitations on activity for underlying diseases  you may have. SEEK MEDICAL CARE IF:   Your condition is not improving after your transfusion.  You develop redness or irritation at the intravenous (IV) site. SEEK IMMEDIATE MEDICAL CARE IF:  Any of the following symptoms occur over the next 12 hours:  Shaking chills.  You have a temperature by mouth above 102 F (38.9 C), not controlled by medicine.  Chest, back, or muscle pain.  People around you feel you are not acting correctly or are confused.  Shortness of breath or difficulty breathing.  Dizziness and fainting.  You get a rash or develop hives.  You have a decrease in urine output.  Your urine turns a dark color or changes to pink, red, or brown. Any of the following symptoms occur over the next 10 days:  You have a temperature by mouth above 102 F (38.9 C), not controlled by medicine.  Shortness of breath.  Weakness after normal activity.  The white part of the eye turns yellow (jaundice).  You have a decrease in the amount of urine or are urinating less often.  Your urine turns a dark color or changes to pink, red, or brown. Document Released: 01/27/2000 Document Revised: 04/23/2011 Document Reviewed: 09/15/2007 [facility] Patient Information 2014 [employer], [place].  _______________________________________________________________________

## 2018-05-06 NOTE — Telephone Encounter (Signed)
Informed of CT scan findings of complex left adnexal mass with solid and cystic components concerning for carcinoma.  No evidence of extraovarian disease or lymphadenopathy.  Discussed that I am recommending robotic assisted hysterectomy with USO and possible BSO and staging pending frozen section findings.  Discussed risks of surgery, postoperative recovery.  Discussed that this is an outpatient same-day procedure.  Patient in agreement with plan.  Surgery offered for later this week.  Discussed that it would be safe to wait for at least 6 to 12 weeks for this procedure based on evidence from the [place] ovarian cancer screening study.  However the patient is electing for surgery at a sooner date.  Thereasa Solo, MD

## 2018-05-07 ENCOUNTER — Ambulatory Visit (HOSPITAL_COMMUNITY): Payer: 59

## 2018-05-07 ENCOUNTER — Encounter (HOSPITAL_COMMUNITY): Payer: Self-pay

## 2018-05-07 ENCOUNTER — Encounter (HOSPITAL_COMMUNITY)
Admission: RE | Admit: 2018-05-07 | Discharge: 2018-05-07 | Disposition: A | Discharge status: Home / Self Care | Payer: 59 | Source: Ambulatory Visit | Attending: Gynecologic Oncology | Admitting: Gynecologic Oncology

## 2018-05-07 ENCOUNTER — Telehealth: Payer: Self-pay | Admitting: Gynecologic Oncology

## 2018-05-07 ENCOUNTER — Other Ambulatory Visit: Payer: Self-pay

## 2018-05-07 DIAGNOSIS — C562 Malignant neoplasm of left ovary: Secondary | ICD-10-CM | POA: Diagnosis not present

## 2018-05-07 DIAGNOSIS — I10 Essential (primary) hypertension: Secondary | ICD-10-CM | POA: Insufficient documentation

## 2018-05-07 DIAGNOSIS — Z01818 Encounter for other preprocedural examination: Secondary | ICD-10-CM | POA: Insufficient documentation

## 2018-05-07 DIAGNOSIS — Z79890 Hormone replacement therapy (postmenopausal): Secondary | ICD-10-CM | POA: Diagnosis not present

## 2018-05-07 DIAGNOSIS — R9431 Abnormal electrocardiogram [ECG] [EKG]: Secondary | ICD-10-CM

## 2018-05-07 DIAGNOSIS — Z79899 Other long term (current) drug therapy: Secondary | ICD-10-CM | POA: Diagnosis not present

## 2018-05-07 DIAGNOSIS — N800 Endometriosis of uterus: Secondary | ICD-10-CM | POA: Diagnosis not present

## 2018-05-07 DIAGNOSIS — N839 Noninflammatory disorder of ovary, fallopian tube and broad ligament, unspecified: Secondary | ICD-10-CM

## 2018-05-07 DIAGNOSIS — N838 Other noninflammatory disorders of ovary, fallopian tube and broad ligament: Secondary | ICD-10-CM | POA: Diagnosis not present

## 2018-05-07 HISTORY — DX: Other complications of anesthesia, initial encounter: T88.59XA

## 2018-05-07 HISTORY — DX: Malignant (primary) neoplasm, unspecified: C80.1

## 2018-05-07 HISTORY — DX: Nausea with vomiting, unspecified: R11.2

## 2018-05-07 HISTORY — DX: Essential (primary) hypertension: I10

## 2018-05-07 HISTORY — DX: Adverse effect of unspecified anesthetic, initial encounter: T41.45XA

## 2018-05-07 HISTORY — DX: Other specified postprocedural states: Z98.890

## 2018-05-07 LAB — COMPREHENSIVE METABOLIC PANEL
ALT: 15 U/L (ref 0–44)
AST: 19 U/L (ref 15–41)
Albumin: 3.7 g/dL (ref 3.5–5.0)
Alkaline Phosphatase: 38 U/L (ref 38–126)
Anion gap: 10 (ref 5–15)
BUN: 10 mg/dL (ref 6–20)
CALCIUM: 8.9 mg/dL (ref 8.9–10.3)
CO2: 26 mmol/L (ref 22–32)
CREATININE: 0.61 mg/dL (ref 0.44–1.00)
Chloride: 96 mmol/L — ABNORMAL LOW (ref 98–111)
GFR calc Af Amer: >60 mL/min (ref >60)
GFR calc non Af Amer: >60 mL/min (ref >60)
Glucose, Bld: 109 mg/dL — ABNORMAL HIGH (ref 70–99)
Potassium: 3.5 mmol/L (ref 3.5–5.1)
Sodium: 132 mmol/L — ABNORMAL LOW (ref 135–145)
Total Bilirubin: 0.6 mg/dL (ref 0.3–1.2)
Total Protein: 7.2 g/dL (ref 6.5–8.1)

## 2018-05-07 LAB — URINALYSIS, ROUTINE W REFLEX MICROSCOPIC
Bilirubin Urine: NEGATIVE
Glucose, UA: NEGATIVE mg/dL
Hgb urine dipstick: NEGATIVE
Ketones, ur: NEGATIVE mg/dL
Leukocytes,Ua: NEGATIVE
Nitrite: NEGATIVE
PH: 8 (ref 5.0–8.0)
Protein, ur: NEGATIVE mg/dL
SPECIFIC GRAVITY, URINE: 1.004 — AB (ref 1.005–1.030)

## 2018-05-07 LAB — CBC
HCT: 36.7 % (ref 36.0–46.0)
Hemoglobin: 12.4 g/dL (ref 12.0–15.0)
MCH: 33.7 pg (ref 26.0–34.0)
MCHC: 33.8 g/dL (ref 30.0–36.0)
MCV: 99.7 fL (ref 80.0–100.0)
NRBC: 0 % (ref 0.0–0.2)
Platelets: 454 10*3/uL — ABNORMAL HIGH (ref 150–400)
RBC: 3.68 MIL/uL — ABNORMAL LOW (ref 3.87–5.11)
RDW: 12.1 % (ref 11.5–15.5)
WBC: 9.7 10*3/uL (ref 4.0–10.5)

## 2018-05-07 LAB — ABO/RH: ABO/RH(D): A POS

## 2018-05-07 NOTE — Anesthesia Preprocedure Evaluation (Addendum)
Anesthesia Evaluation  Patient identified by MRN, date of birth, ID band Patient awake    Reviewed: Allergy & Precautions, NPO status , Patient's Chart, lab work & pertinent test results  History of Anesthesia Complications (+) PONV and history of anesthetic complications  Airway Mallampati: II  TM Distance: >3 FB Neck ROM: Full    Dental no notable dental hx. (+) Dental Advisory Given, Teeth Intact   Pulmonary neg pulmonary ROS,    Pulmonary exam normal breath sounds clear to auscultation       Cardiovascular hypertension, negative cardio ROS Normal cardiovascular exam Rhythm:Regular Rate:Normal     Neuro/Psych negative neurological ROS     GI/Hepatic negative GI ROS, Neg liver ROS,   Endo/Other  negative endocrine ROS  Renal/GU negative Renal ROS     Musculoskeletal negative musculoskeletal ROS (+)   Abdominal   Peds  Hematology negative hematology ROS (+)   Anesthesia Other Findings Day of surgery medications reviewed with the patient.  Reproductive/Obstetrics                            Anesthesia Physical Anesthesia Plan  ASA: II  Anesthesia Plan: General   Post-op Pain Management:    Induction: Intravenous and Rapid sequence  PONV Risk Score and Plan: Ondansetron, Dexamethasone, Midazolam, Propofol infusion, Scopolamine patch - Pre-op and Treatment may vary due to age or medical condition  Airway Management Planned: Oral ETT  Additional Equipment: None  Intra-op Plan:   Post-operative Plan: Extubation in OR  Informed Consent: I have reviewed the patients History and Physical, chart, labs and discussed the procedure including the risks, benefits and alternatives for the proposed anesthesia with the patient or authorized representative who has indicated his/her understanding and acceptance.     Dental advisory given  Plan Discussed with: CRNA  Anesthesia Plan  Comments:         Anesthesia Quick Evaluation

## 2018-05-08 ENCOUNTER — Ambulatory Visit (HOSPITAL_COMMUNITY): Payer: 59 | Admitting: Anesthesiology

## 2018-05-08 ENCOUNTER — Other Ambulatory Visit: Payer: Self-pay

## 2018-05-08 ENCOUNTER — Encounter (HOSPITAL_COMMUNITY)
Admission: RE | Disposition: A | Discharge status: Home / Self Care | Payer: Self-pay | Source: Home / Self Care | Attending: Gynecologic Oncology

## 2018-05-08 ENCOUNTER — Encounter (HOSPITAL_COMMUNITY): Payer: Self-pay | Admitting: Emergency Medicine

## 2018-05-08 ENCOUNTER — Ambulatory Visit (HOSPITAL_COMMUNITY)
Admission: RE | Admit: 2018-05-08 | Discharge: 2018-05-08 | Disposition: A | Discharge status: Home / Self Care | Payer: 59 | Attending: Gynecologic Oncology | Admitting: Gynecologic Oncology

## 2018-05-08 DIAGNOSIS — I10 Essential (primary) hypertension: Secondary | ICD-10-CM | POA: Insufficient documentation

## 2018-05-08 DIAGNOSIS — N800 Endometriosis of uterus: Secondary | ICD-10-CM | POA: Insufficient documentation

## 2018-05-08 DIAGNOSIS — Z79890 Hormone replacement therapy (postmenopausal): Secondary | ICD-10-CM | POA: Insufficient documentation

## 2018-05-08 DIAGNOSIS — N838 Other noninflammatory disorders of ovary, fallopian tube and broad ligament: Secondary | ICD-10-CM | POA: Diagnosis not present

## 2018-05-08 DIAGNOSIS — C562 Malignant neoplasm of left ovary: Secondary | ICD-10-CM

## 2018-05-08 DIAGNOSIS — Z79899 Other long term (current) drug therapy: Secondary | ICD-10-CM | POA: Insufficient documentation

## 2018-05-08 HISTORY — PX: ROBOTIC ASSISTED TOTAL HYSTERECTOMY WITH BILATERAL SALPINGO OOPHERECTOMY: SHX6086

## 2018-05-08 LAB — TYPE AND SCREEN
ABO/RH(D): A POS
Antibody Screen: NEGATIVE

## 2018-05-08 LAB — CA 125: Cancer Antigen (CA) 125: 64.9 U/mL — ABNORMAL HIGH (ref 0.0–38.1)

## 2018-05-08 SURGERY — HYSTERECTOMY, TOTAL, ROBOT-ASSISTED, LAPAROSCOPIC, WITH BILATERAL SALPINGO-OOPHORECTOMY
Anesthesia: General | Laterality: Bilateral

## 2018-05-08 MED ORDER — MIDAZOLAM HCL 2 MG/2ML IJ SOLN
INTRAMUSCULAR | Status: AC
  Filled 2018-05-08: qty 2

## 2018-05-08 MED ORDER — SUGAMMADEX SODIUM 200 MG/2ML IV SOLN
INTRAVENOUS | Status: DC | PRN
  Administered 2018-05-08: 200 mg via INTRAVENOUS

## 2018-05-08 MED ORDER — OXYCODONE-ACETAMINOPHEN 5-325 MG PO TABS: 1.0000 | ORAL_TABLET | Freq: Four times a day (QID) | ORAL | 0 refills | Status: DC | PRN

## 2018-05-08 MED ORDER — CELECOXIB 200 MG PO CAPS
200.0000 mg | ORAL_CAPSULE | Freq: Once | ORAL | Status: AC | PRN
  Administered 2018-05-08: 200 mg via ORAL
  Filled 2018-05-08: qty 1

## 2018-05-08 MED ORDER — MIDAZOLAM HCL 5 MG/5ML IJ SOLN
INTRAMUSCULAR | Status: DC | PRN
  Administered 2018-05-08: 2 mg via INTRAVENOUS

## 2018-05-08 MED ORDER — OXYCODONE HCL 5 MG PO TABS
ORAL_TABLET | ORAL | Status: AC
  Filled 2018-05-08: qty 1

## 2018-05-08 MED ORDER — ONDANSETRON HCL 4 MG/2ML IJ SOLN
INTRAMUSCULAR | Status: DC | PRN
  Administered 2018-05-08: 8 mg via INTRAVENOUS

## 2018-05-08 MED ORDER — ACETAMINOPHEN 325 MG PO TABS: 650.0000 mg | ORAL_TABLET | ORAL | Status: DC | PRN

## 2018-05-08 MED ORDER — OXYCODONE-ACETAMINOPHEN 5-325 MG PO TABS: 1.0000 | ORAL_TABLET | ORAL | 0 refills | Status: DC | PRN

## 2018-05-08 MED ORDER — GABAPENTIN 300 MG PO CAPS: 300.0000 mg | ORAL_CAPSULE | Freq: Once | ORAL | Status: AC | PRN

## 2018-05-08 MED ORDER — LACTATED RINGERS IR SOLN
Status: DC | PRN
  Administered 2018-05-08: 1000 mL

## 2018-05-08 MED ORDER — GLYCOPYRROLATE PF 0.2 MG/ML IJ SOSY
PREFILLED_SYRINGE | INTRAMUSCULAR | Status: AC
  Filled 2018-05-08: qty 1

## 2018-05-08 MED ORDER — ROCURONIUM BROMIDE 10 MG/ML (PF) SYRINGE
PREFILLED_SYRINGE | INTRAVENOUS | Status: DC | PRN
  Administered 2018-05-08: 50 mg via INTRAVENOUS

## 2018-05-08 MED ORDER — ROCURONIUM BROMIDE 10 MG/ML (PF) SYRINGE
PREFILLED_SYRINGE | INTRAVENOUS | Status: AC
  Filled 2018-05-08: qty 10

## 2018-05-08 MED ORDER — OXYCODONE HCL 5 MG PO TABS: 5.0000 mg | ORAL_TABLET | ORAL | Status: DC | PRN

## 2018-05-08 MED ORDER — MORPHINE SULFATE (PF) 4 MG/ML IV SOLN: 2.0000 mg | INTRAVENOUS | Status: DC | PRN

## 2018-05-08 MED ORDER — ACETAMINOPHEN 160 MG/5ML PO SOLN: 960.0000 mg | Freq: Once | ORAL | Status: AC

## 2018-05-08 MED ORDER — CELECOXIB 200 MG PO CAPS: 400.0000 mg | ORAL_CAPSULE | ORAL | Status: AC

## 2018-05-08 MED ORDER — STERILE WATER FOR IRRIGATION IR SOLN
Status: DC | PRN
  Administered 2018-05-08: 1000 mL

## 2018-05-08 MED ORDER — OXYCODONE HCL 5 MG PO TABS
5.0000 mg | ORAL_TABLET | Freq: Once | ORAL | Status: AC
  Administered 2018-05-08: 5 mg via ORAL

## 2018-05-08 MED ORDER — KETAMINE HCL 10 MG/ML IJ SOLN
INTRAMUSCULAR | Status: DC | PRN
  Administered 2018-05-08: 30 mg via INTRAVENOUS

## 2018-05-08 MED ORDER — DEXAMETHASONE SODIUM PHOSPHATE 10 MG/ML IJ SOLN
INTRAMUSCULAR | Status: AC
  Filled 2018-05-08: qty 1

## 2018-05-08 MED ORDER — GABAPENTIN 300 MG PO CAPS
300.0000 mg | ORAL_CAPSULE | ORAL | Status: AC
  Administered 2018-05-08: 300 mg via ORAL
  Filled 2018-05-08: qty 1

## 2018-05-08 MED ORDER — ENOXAPARIN SODIUM 40 MG/0.4ML ~~LOC~~ SOLN
40.0000 mg | SUBCUTANEOUS | Status: AC
  Administered 2018-05-08: 40 mg via SUBCUTANEOUS
  Filled 2018-05-08: qty 0.4

## 2018-05-08 MED ORDER — CEFAZOLIN SODIUM-DEXTROSE 2-4 GM/100ML-% IV SOLN
2.0000 g | INTRAVENOUS | Status: AC
  Administered 2018-05-08: 2 g via INTRAVENOUS
  Filled 2018-05-08: qty 100

## 2018-05-08 MED ORDER — MEPERIDINE HCL 50 MG/ML IJ SOLN: 6.2500 mg | INTRAMUSCULAR | Status: DC | PRN

## 2018-05-08 MED ORDER — BUPIVACAINE HCL (PF) 0.25 % IJ SOLN
INTRAMUSCULAR | Status: AC
  Filled 2018-05-08: qty 30

## 2018-05-08 MED ORDER — SODIUM CHLORIDE 0.9 % IV SOLN
INTRAVENOUS | Status: DC | PRN
  Administered 2018-05-08: 50 ug/min via INTRAVENOUS

## 2018-05-08 MED ORDER — SUCCINYLCHOLINE CHLORIDE 200 MG/10ML IV SOSY
PREFILLED_SYRINGE | INTRAVENOUS | Status: DC | PRN
  Administered 2018-05-08: 100 mg via INTRAVENOUS

## 2018-05-08 MED ORDER — LIDOCAINE 2% (20 MG/ML) 5 ML SYRINGE
INTRAMUSCULAR | Status: AC
  Filled 2018-05-08: qty 5

## 2018-05-08 MED ORDER — SUGAMMADEX SODIUM 200 MG/2ML IV SOLN
INTRAVENOUS | Status: AC
  Filled 2018-05-08: qty 2

## 2018-05-08 MED ORDER — BUPIVACAINE HCL 0.25 % IJ SOLN
INTRAMUSCULAR | Status: DC | PRN
  Administered 2018-05-08: 18 mL

## 2018-05-08 MED ORDER — EPHEDRINE 5 MG/ML INJ
INTRAVENOUS | Status: AC
  Filled 2018-05-08: qty 10

## 2018-05-08 MED ORDER — LACTATED RINGERS IV SOLN
INTRAVENOUS | Status: DC
  Administered 2018-05-08 (×3): via INTRAVENOUS

## 2018-05-08 MED ORDER — DEXAMETHASONE SODIUM PHOSPHATE 10 MG/ML IJ SOLN
INTRAMUSCULAR | Status: DC | PRN
  Administered 2018-05-08: 8 mg via INTRAVENOUS

## 2018-05-08 MED ORDER — SODIUM CHLORIDE 0.9% FLUSH: 3.0000 mL | Freq: Two times a day (BID) | INTRAVENOUS | Status: DC

## 2018-05-08 MED ORDER — SODIUM CHLORIDE 0.9 % IV SOLN: 250.0000 mL | INTRAVENOUS | Status: DC | PRN

## 2018-05-08 MED ORDER — SCOPOLAMINE 1 MG/3DAYS TD PT72
1.0000 | MEDICATED_PATCH | TRANSDERMAL | Status: DC
  Administered 2018-05-08: 1.5 mg via TRANSDERMAL
  Filled 2018-05-08: qty 1

## 2018-05-08 MED ORDER — SENNOSIDES-DOCUSATE SODIUM 8.6-50 MG PO TABS: 1.0000 | ORAL_TABLET | Freq: Every day | ORAL | 2 refills | Status: AC

## 2018-05-08 MED ORDER — EPHEDRINE SULFATE-NACL 50-0.9 MG/10ML-% IV SOSY
PREFILLED_SYRINGE | INTRAVENOUS | Status: DC | PRN
  Administered 2018-05-08 (×2): 10 mg via INTRAVENOUS

## 2018-05-08 MED ORDER — FENTANYL CITRATE (PF) 250 MCG/5ML IJ SOLN
INTRAMUSCULAR | Status: AC
  Filled 2018-05-08: qty 5

## 2018-05-08 MED ORDER — LIDOCAINE 2% (20 MG/ML) 5 ML SYRINGE
INTRAMUSCULAR | Status: DC | PRN
  Administered 2018-05-08: 1.5 mg/kg/h via INTRAVENOUS

## 2018-05-08 MED ORDER — ACETAMINOPHEN 500 MG PO TABS
1000.0000 mg | ORAL_TABLET | Freq: Once | ORAL | Status: AC
  Administered 2018-05-08: 1000 mg via ORAL
  Filled 2018-05-08: qty 2

## 2018-05-08 MED ORDER — PROPOFOL 10 MG/ML IV BOLUS
INTRAVENOUS | Status: AC
  Filled 2018-05-08: qty 20

## 2018-05-08 MED ORDER — DEXAMETHASONE SODIUM PHOSPHATE 4 MG/ML IJ SOLN: 4.0000 mg | INTRAMUSCULAR | Status: DC

## 2018-05-08 MED ORDER — SUCCINYLCHOLINE CHLORIDE 200 MG/10ML IV SOSY
PREFILLED_SYRINGE | INTRAVENOUS | Status: AC
  Filled 2018-05-08: qty 10

## 2018-05-08 MED ORDER — FENTANYL CITRATE (PF) 250 MCG/5ML IJ SOLN
INTRAMUSCULAR | Status: DC | PRN
  Administered 2018-05-08 (×5): 50 ug via INTRAVENOUS

## 2018-05-08 MED ORDER — HYDROMORPHONE HCL 1 MG/ML IJ SOLN: 0.2500 mg | INTRAMUSCULAR | Status: DC | PRN

## 2018-05-08 MED ORDER — SODIUM CHLORIDE 0.9% FLUSH: 3.0000 mL | INTRAVENOUS | Status: DC | PRN

## 2018-05-08 MED ORDER — PROMETHAZINE HCL 25 MG/ML IJ SOLN: 6.2500 mg | INTRAMUSCULAR | Status: DC | PRN

## 2018-05-08 MED ORDER — ONDANSETRON HCL 4 MG/2ML IJ SOLN
INTRAMUSCULAR | Status: AC
  Filled 2018-05-08: qty 4

## 2018-05-08 MED ORDER — PROPOFOL 10 MG/ML IV BOLUS
INTRAVENOUS | Status: DC | PRN
  Administered 2018-05-08: 150 mg via INTRAVENOUS

## 2018-05-08 MED ORDER — LIDOCAINE 2% (20 MG/ML) 5 ML SYRINGE
INTRAMUSCULAR | Status: DC | PRN
  Administered 2018-05-08: 80 mg via INTRAVENOUS

## 2018-05-08 MED ORDER — ACETAMINOPHEN 650 MG RE SUPP
650.0000 mg | RECTAL | Status: DC | PRN
  Filled 2018-05-08: qty 1

## 2018-05-08 MED ORDER — ACETAMINOPHEN 500 MG PO TABS: 1000.0000 mg | ORAL_TABLET | ORAL | Status: AC

## 2018-05-08 MED ORDER — KETAMINE HCL 10 MG/ML IJ SOLN
INTRAMUSCULAR | Status: AC
  Filled 2018-05-08: qty 1

## 2018-05-08 SURGICAL SUPPLY — 57 items
ADH SKN CLS APL DERMABOND .7 (GAUZE/BANDAGES/DRESSINGS) ×1
AGENT HMST KT MTR STRL THRMB (HEMOSTASIS)
APL ESCP 34 STRL LF DISP (HEMOSTASIS)
APPLICATOR SURGIFLO ENDO (HEMOSTASIS) IMPLANT
BAG LAPAROSCOPIC 12 15 PORT 16 (BASKET) IMPLANT
BAG RETRIEVAL 12/15 (BASKET)
BAG SPEC RTRVL LRG 6X4 10 (ENDOMECHANICALS) ×2
COVER BACK TABLE 60X90IN (DRAPES) ×2 IMPLANT
COVER TIP SHEARS 8 DVNC (MISCELLANEOUS) ×1 IMPLANT
COVER TIP SHEARS 8MM DA VINCI (MISCELLANEOUS) ×1
COVER WAND RF STERILE (DRAPES) IMPLANT
DECANTER SPIKE VIAL GLASS SM (MISCELLANEOUS) ×1 IMPLANT
DERMABOND ADVANCED (GAUZE/BANDAGES/DRESSINGS) ×1
DERMABOND ADVANCED .7 DNX12 (GAUZE/BANDAGES/DRESSINGS) ×1 IMPLANT
DRAPE ARM DVNC X/XI (DISPOSABLE) ×4 IMPLANT
DRAPE COLUMN DVNC XI (DISPOSABLE) ×1 IMPLANT
DRAPE DA VINCI XI ARM (DISPOSABLE) ×4
DRAPE DA VINCI XI COLUMN (DISPOSABLE) ×1
DRAPE SHEET LG 3/4 BI-LAMINATE (DRAPES) ×2 IMPLANT
DRAPE SURG IRRIG POUCH 19X23 (DRAPES) ×2 IMPLANT
ELECT REM PT RETURN 15FT ADLT (MISCELLANEOUS) ×2 IMPLANT
GLOVE BIO SURGEON STRL SZ 6 (GLOVE) ×8 IMPLANT
GLOVE BIO SURGEON STRL SZ 6.5 (GLOVE) ×2 IMPLANT
GOWN SRG XL LVL 4 BRTHBL STRL (GOWNS) IMPLANT
GOWN STRL NON-REIN XL LVL4 (GOWNS) ×4
GOWN STRL REUS W/ TWL LRG LVL3 (GOWN DISPOSABLE) ×2 IMPLANT
GOWN STRL REUS W/TWL LRG LVL3 (GOWN DISPOSABLE) ×4
HOLDER FOLEY CATH W/STRAP (MISCELLANEOUS) ×2 IMPLANT
IRRIG SUCT STRYKERFLOW 2 WTIP (MISCELLANEOUS) ×2
IRRIGATION SUCT STRKRFLW 2 WTP (MISCELLANEOUS) ×1 IMPLANT
KIT PROCEDURE DA VINCI SI (MISCELLANEOUS)
KIT PROCEDURE DVNC SI (MISCELLANEOUS) IMPLANT
KIT TURNOVER KIT A (KITS) ×1 IMPLANT
MANIPULATOR UTERINE 4.5 ZUMI (MISCELLANEOUS) ×2 IMPLANT
NDL SPNL 18GX3.5 QUINCKE PK (NEEDLE) IMPLANT
NEEDLE HYPO 22GX1.5 SAFETY (NEEDLE) ×1 IMPLANT
NEEDLE SPNL 18GX3.5 QUINCKE PK (NEEDLE) IMPLANT
OBTURATOR OPTICAL STANDARD 8MM (TROCAR) ×1
OBTURATOR OPTICAL STND 8 DVNC (TROCAR) ×1
OBTURATOR OPTICALSTD 8 DVNC (TROCAR) ×1 IMPLANT
PACK ROBOT GYN CUSTOM WL (TRAY / TRAY PROCEDURE) ×2 IMPLANT
PAD POSITIONING PINK XL (MISCELLANEOUS) ×2 IMPLANT
PORT ACCESS TROCAR AIRSEAL 12 (TROCAR) ×1 IMPLANT
PORT ACCESS TROCAR AIRSEAL 5M (TROCAR) ×1
POUCH SPECIMEN RETRIEVAL 10MM (ENDOMECHANICALS) ×2 IMPLANT
SEAL CANN UNIV 5-8 DVNC XI (MISCELLANEOUS) ×4 IMPLANT
SEAL XI 5MM-8MM UNIVERSAL (MISCELLANEOUS) ×4
SET TRI-LUMEN FLTR TB AIRSEAL (TUBING) ×2 IMPLANT
SURGIFLO W/THROMBIN 8M KIT (HEMOSTASIS) IMPLANT
SUT VIC AB 0 CT1 27 (SUTURE)
SUT VIC AB 0 CT1 27XBRD ANTBC (SUTURE) IMPLANT
SYR 10ML LL (SYRINGE) IMPLANT
TOWEL OR NON WOVEN STRL DISP B (DISPOSABLE) ×2 IMPLANT
TRAP SPECIMEN MUCOUS 40CC (MISCELLANEOUS) ×1 IMPLANT
TRAY FOLEY MTR SLVR 16FR STAT (SET/KITS/TRAYS/PACK) ×2 IMPLANT
UNDERPAD 30X30 (UNDERPADS AND DIAPERS) ×2 IMPLANT
WATER STERILE IRR 1000ML POUR (IV SOLUTION) ×2 IMPLANT

## 2018-05-08 NOTE — Discharge Instructions (Signed)
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 However, representative biopsies were taken from areas that this cancer could have spread to and these will be evaluated under the microscope to ensure there was no spread and to determine if additional treatment (such as chemotherapy) is necessary. Dr Serita Grit office will contact you as soon as the pathology results are back. This may take up to a week.   Return to work: 4 weeks  Activity: 1. Be up and out of the bed during the day.  Take a nap if needed.  You may walk up steps but be careful and use the hand rail.  Stair climbing will tire you more than you think, you may need to stop part way and rest.   2. No lifting or straining for 4 weeks.  3. No driving for 1 weeks.  Do Not drive if you are taking narcotic pain medicine.  4. Shower daily.  Use soap and water on your incision and pat dry; don't rub.   5. No sexual activity and nothing in the vagina for 8 weeks.  Medications:  - Take ibuprofen and tylenol first line for pain control. Take these regularly (every 6 hours) to decrease the build up of pain.  - If necessary, for severe pain not relieved by ibuprofen, take percocet.  - While taking percocet you should take sennakot every night to reduce the likelihood of constipation. If this causes diarrhea, stop its use.  Diet: 1. Low sodium Heart Healthy Diet is recommended.  2. It is safe to use a laxative if you have difficulty moving your bowels.   Wound Care: 1. Keep clean and dry.  Shower daily. 2. The dressing on the left incision was placed to apply pressure where it was oozing. This can be removed in 12-24 hours. You can get this site wet before and after the dressing is removed.  Reasons to call the Doctor:   Fever - Oral temperature greater than 100.4 degrees Fahrenheit  Foul-smelling vaginal discharge  Difficulty urinating  Nausea and  vomiting  Increased pain at the site of the incision that is unrelieved with pain medicine.  Difficulty breathing with or without chest pain  New calf pain especially if only on one side  Sudden, continuing increased vaginal bleeding with or without clots.   Follow-up: 1. See Everitt Amber in 3 weeks.  Contacts: For questions or concerns you should contact:  Dr. Everitt Amber at [phone] After hours and on week-ends call [phone] and ask to speak to the physician on call for Gynecologic Oncology

## 2018-05-08 NOTE — Anesthesia Procedure Notes (Signed)
Procedure Name: Intubation Date/Time: 05/08/2018 7:38 AM Performed by: Mitzie Na, CRNA Pre-anesthesia Checklist: Patient identified, Emergency Drugs available, Suction available, Patient being monitored and Timeout performed Patient Re-evaluated:Patient Re-evaluated prior to induction Oxygen Delivery Method: Circle system utilized Preoxygenation: Pre-oxygenation with 100% oxygen Induction Type: IV induction, Rapid sequence and Cricoid Pressure applied Laryngoscope Size: Mac and 3 Grade View: Grade I Tube type: Oral Tube size: 7.0 mm Number of attempts: 1 Airway Equipment and Method: Stylet Placement Confirmation: ETT inserted through vocal cords under direct vision,  positive ETCO2 and breath sounds checked- equal and bilateral Secured at: 24 cm Tube secured with: Tape Dental Injury: Teeth and Oropharynx as per pre-operative assessment

## 2018-05-08 NOTE — Anesthesia Postprocedure Evaluation (Signed)
Anesthesia Post Note  Patient: Sara Werner  Procedure(s) Performed: XI ROBOTIC ASSISTED TOTAL HYSTERECTOMY BILATERAL SALPINGO-OOPHORECTOMY,OMENTECTOMY WITH STAGING (Bilateral )     Patient location during evaluation: PACU Anesthesia Type: General Level of consciousness: sedated and patient cooperative Pain management: pain level controlled Vital Signs Assessment: post-procedure vital signs reviewed and stable Respiratory status: spontaneous breathing Cardiovascular status: stable Anesthetic complications: no    Last Vitals:  Vitals:   05/08/18 1145 05/08/18 1245  BP: 109/72 118/84  Pulse: 69 68  Resp: 16   Temp:    SpO2: 98% 97%    Last Pain:  Vitals:   05/08/18 1245  TempSrc:   PainSc: 0-No pain                 Nolon Nations

## 2018-05-08 NOTE — Op Note (Signed)
OPERATIVE NOTE 05/08/18  Surgeon: Donaciano Eva   Assistants: Dr Lahoma Crocker (an MD assistant was necessary for tissue manipulation, management of robotic instrumentation, retraction and positioning due to the complexity of the case and hospital policies).   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 Left ovary adherent to posterior uterus and anterior rectal wall.  Normal appearing omentum and peritoneum including diaphragms. No apparent metastatic spread.   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         Disposition: PACU - hemodynamically stable.  Procedure Details  The patient was seen in the Holding Room. The risks, benefits, complications, treatment options, and expected outcomes were discussed with the patient.  The patient concurred with the proposed plan, giving informed consent.  The site of  surgery properly noted/marked. The patient was identified as Sara Werner and the procedure verified as a Robotic-assisted hysterectomy with unilateral salpingo oophorectomy possible BSO, possible staging. A Time Out was held and the above information confirmed.  After induction of anesthesia, the patient was draped and prepped in the usual sterile manner. Pt was placed in supine position after anesthesia and draped and prepped in the usual sterile manner. The abdominal drape was placed after the CholoraPrep had been allowed to dry for 3 minutes.  Her arms were tucked to her side with all appropriate precautions.  The shoulders were stabilized with padded shoulder blocks applied to the acromium processes.  The patient was placed in the semi-lithotomy position in [place].  The perineum was prepped with Betadine. The patient was then prepped. Foley catheter was placed.  A sterile speculum was placed in the vagina.  The cervix was grasped with a single-tooth tenaculum and dilated with Kennon Rounds dilators.  The ZUMI uterine manipulator with a medium colpotomizer ring was placed without difficulty.  A pneum occluder balloon was placed over the manipulator.  OG tube placement was confirmed and to suction.   Next, a 5 mm skin incision was made 1 cm below the subcostal margin in the midclavicular line.  The 5 mm Optiview port and scope was used for direct entry.  Opening pressure was under 10 mm CO2.  The abdomen was insufflated and the findings were noted as above.   At this point and all points during the procedure, the patient's intra-abdominal pressure did not exceed 15 mmHg. Next, a 8 mm skin incision was made in the umbilicus and a right and left port was placed about 10 cm lateral to the  robot port on the right and left side.  A fourth arm was placed in the left lower quadrant 2 cm above and superior and medial to the anterior superior iliac spine.  All ports were placed under direct visualization.  The  patient was placed in steep Trendelenburg.  Bowel was folded away into the upper abdomen.  The robot was docked in the normal manner.   Washings/aspiration of pelvic fluid was performed.   The hysterectomy was started after the round ligament on the right side was incised and the retroperitoneum was entered and the pararectal space was developed.  The ureter was noted to be on the medial leaf of the broad ligament.  The peritoneum above the ureter was incised and stretched and the fallopian tube was separated off of the ovary and the utero-ovarian ligament was skeletonized, cauterized and cut.  The posterior peritoneum was taken down to the level of the KOH ring.  The anterior peritoneum was also taken down.  The bladder flap was created to the level of the KOH ring.  The uterine artery on the right side was skeletonized, cauterized and cut in the normal manner.    Adhesiolysis took place to free adhesions from the the cystic mass and the rectum posteriorally and the uterus anteriorally. The round ligament on the left side was incised and the retroperitoneum was entered and the pararectal space was developed.  The ureter was noted to be on the medial leaf of the broad ligament.  The peritoneum above the ureter was incised and stretched and the fallopian tube was separated off of the ovary and the utero-ovarian ligament was skeletonized, cauterized and cut.  The posterior peritoneum was taken down to the level of the KOH ring.  The anterior peritoneum was also taken down.  The bladder flap was created to the level of the KOH ring.    The colpotomy was made and the uterus, cervix, bilateral tubes and left ovary were amputated and delivered through the vagina.  Pedicles were inspected and excellent hemostasis was achieved.    When frozen section returned as mucinous adenocarcinoma, staging was performed. The appendix was grossly normal and therefore this was not removed. Lymphadenectomy was not performed due  to the mucinous cell type of tumor and the extremely rare association of this with lymph node metastases. Peritoneal biopsies were taken from representative areas from the anterior and posterior cul de sac, left and right pelvis and left and right paracolic gutters (abdominal), and anterior left upper quadrant peritoneum.  The omentectomy was then performed. The omentum was delivered into the mid abdomen and elevated. The parietal peritonum that attaches the omentum to the transverse colon was incised facilitating separation of the mid portion of the omentum from the transverse colon. The right sided omentum with its vascular pedicles was then skeletonized in its attachments to the right transverse colon. These vascular pedicles were bipolar sealed and transected. Observation of the colonic wall occurred throughout. The dissection then moved progressively along the colon the the left splenic flexure of the transverse colon. The bipolar sealing forceps and the scissors were used to skeletonize vascular omental pedicles, seal them and transect them until the entire infracolic omentum had been freed from its transverse colonic attachments to the splenic flexure. The omentum was then placed in an endocatch bag and retrieved from the vagina.  The colpotomy at the vaginal cuff was closed with Vicryl on a CT1 needle in a running manner.  Irrigation was used and excellent hemostasis  was achieved.  At this point in the procedure was completed.  Robotic instruments were removed under direct visulaization.  The robot was undocked. The 10 mm ports were closed with Vicryl on a UR-5 needle and the fascia was closed with 0 Vicryl on a UR-5 needle.  The skin was closed with 4-0 Vicryl in a subcuticular manner.  Dermabond was applied.  Sponge, lap and needle counts correct x 2.  The patient was taken to the recovery room in stable condition.  The vagina was swabbed with  minimal bleeding noted.   All instrument and needle  counts were correct x  3.   The patient was transferred to the recovery room in a stable condition.  Donaciano Eva, MD

## 2018-05-08 NOTE — Transfer of Care (Signed)
Immediate Anesthesia Transfer of Care Note  Patient: Sara Werner  Procedure(s) Performed: XI ROBOTIC ASSISTED TOTAL HYSTERECTOMY BILATERAL SALPINGO-OOPHORECTOMY,OMENTECTOMY WITH STAGING (Bilateral )  Patient Location: PACU  Anesthesia Type:General  Level of Consciousness: awake, alert , oriented and patient cooperative  Airway & Oxygen Therapy: Patient Spontanous Breathing and Patient connected to face mask oxygen  Post-op Assessment: Report given to RN, Post -op Vital signs reviewed and stable and Patient moving all extremities  Post vital signs: Reviewed and stable  Last Vitals:  Vitals Value Taken Time  BP 111/82 05/08/2018 10:15 AM  Temp    Pulse 66 05/08/2018 10:18 AM  Resp 18 05/08/2018 10:18 AM  SpO2 100 % 05/08/2018 10:18 AM  Vitals shown include unvalidated device data.  Last Pain:  Vitals:   05/08/18 0541  TempSrc:   PainSc: 3       Patients Stated Pain Goal: 3 (96/29/52 8413)  Complications: No apparent anesthesia complications

## 2018-05-08 NOTE — Telephone Encounter (Signed)
Called patient to discuss pre-op instructions, pre-op labs, and to inform her that her surgery is still in review in regards to prior auth with her insurance.  Left message with patient asking her to please call the office.

## 2018-05-08 NOTE — Interval H&P Note (Signed)
History and Physical Interval Note:  05/08/2018 7:10 AM  Sara Werner  has presented today for surgery, with the diagnosis of COMPLEX OVARIAN MASS.  The various methods of treatment have been discussed with the patient and family. After consideration of risks, benefits and other options for treatment, the patient has consented to  Procedure(s): XI ROBOTIC [place] (N/A) as a surgical intervention.  The patient's history has been reviewed, patient examined, no change in status, stable for surgery.  I have reviewed the patient's chart and labs.  Questions were answered to the patient's satisfaction.     Thereasa Solo

## 2018-05-09 ENCOUNTER — Telehealth: Payer: Self-pay

## 2018-05-09 ENCOUNTER — Encounter (HOSPITAL_COMMUNITY): Payer: Self-pay | Admitting: Gynecologic Oncology

## 2018-05-09 ENCOUNTER — Telehealth: Payer: Self-pay | Admitting: Gynecologic Oncology

## 2018-05-09 LAB — CEA: CEA: 2.3 ng/mL (ref 0.0–4.7)

## 2018-05-09 NOTE — Telephone Encounter (Signed)
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 Recommendation is for close surveillance with 6 monthly exams and assessment of tumor markers.  Patient expressed understanding. Husband attended phone call.  Thereasa Solo, MD

## 2018-05-09 NOTE — Telephone Encounter (Signed)
Ms Narayan  States she is doing well.   Her right side is most sore. Told her that the right sided arm of the robot may have been doing more work resulting in the increased tenderness on that side. Pt has a cotton ball on one incision.  tols her that she can remove this per Melissa CrossNP. She has not moved bowels but is passing some gas. She did begin the Senokot -S. Pain controled with current medications. Eating and drinking well. Told her that her sodium level was a little lower on pre op labs. Per Joylene John, NP. She needs to increase sodium in diet eg. a soup a day and not just drink water for fluids.  Pt knows to call the office at [phone] if she has any questions or concerns.

## 2018-05-26 ENCOUNTER — Telehealth: Payer: Self-pay | Admitting: *Deleted

## 2018-05-26 NOTE — Telephone Encounter (Signed)
1. Called and left the patient a message to call the office back regarding her appt on 4/15. Patient needs to per screened and giving instructions for no visitors/parking  2. Returned the husband call, explained that I would given Dr. Denman George the message tomorrow that he wants to speak with her either via phone or e-mail regarding tumor profiling requisition.

## 2018-05-26 NOTE — Telephone Encounter (Signed)
Patient called back and was giving the no visitor policy and new parking. Patient has had no signs/symptoms, traveled or an exposure. Patient also has had no contact with anyone sick, traveled of had an exposure.

## 2018-05-28 ENCOUNTER — Inpatient Hospital Stay: Payer: 59 | Attending: Gynecologic Oncology | Admitting: Gynecologic Oncology

## 2018-05-28 ENCOUNTER — Other Ambulatory Visit: Payer: Self-pay

## 2018-05-28 ENCOUNTER — Encounter: Payer: Self-pay | Admitting: Gynecologic Oncology

## 2018-05-28 VITALS — BP 128/77 | HR 100 | Temp 97.6°F | Resp 20 | Ht 62.0 in | Wt 125.0 lb

## 2018-05-28 DIAGNOSIS — Z7189 Other specified counseling: Secondary | ICD-10-CM | POA: Insufficient documentation

## 2018-05-28 DIAGNOSIS — Z90722 Acquired absence of ovaries, bilateral: Secondary | ICD-10-CM | POA: Insufficient documentation

## 2018-05-28 DIAGNOSIS — Z90710 Acquired absence of both cervix and uterus: Secondary | ICD-10-CM | POA: Diagnosis not present

## 2018-05-28 DIAGNOSIS — C562 Malignant neoplasm of left ovary: Secondary | ICD-10-CM | POA: Diagnosis present

## 2018-05-28 NOTE — Patient Instructions (Signed)
Please notify Dr Denman George at phone number [phone] if you notice vaginal bleeding, new pelvic or abdominal pains, bloating, feeling full easy, or a change in bladder or bowel function.   Please contact Dr Serita Grit office (at [phone]) in July, 2020 to request an appointment with her for September, 2020.  Dr Denman George is recommending a consultation with genetics counselors as we recommend this for all patients with epithelial ovarian cancer due to the 1 in 4 risk for carrying a hereditary predisposition to cancer. The genetics counselors will review your case and your family history and determine if testing is appropriate and if so, the most appropriate testing to perform.

## 2018-05-28 NOTE — Progress Notes (Signed)
Follow-up Note: Gyn-Onc  Consult was initially requested by Dr. Nori Riis for the evaluation of Sara Werner 58 y.o. female  CC:  Chief Complaint  Patient presents with  . Ovarian CA, left [facility])    Assessment/Plan:  [name]  is a 58 y.o.  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 No adjuvant therapy recommended due to low risk factors for recurrence.  Recommend consultation with genetics.  Recommend 6 monthly follow-up with pelvic examination and tumor marker (CA 125 and CEA assessment).   HPI: [name] is a 58 year old woman who is seen in consultation at the request of Dr Nori Riis for a left ovarian mass and elevated CA 125.  The patient had an episode of straining to have a bowel movement approximately 1 week ago in March 2020.  She then felt some pelvic pressure.  She returned to [facility] and reported this to her OB/GYN GYN who performed a transvaginal ultrasound scan on May 01, 2018.  This revealed a uterus measuring 8 x 4 x 5 cm with an endometrium of 6 mm.  The right ovary was not visualized.  The left adnexa revealed a 10.6 x 6.5 x 9.8 cm complex mass seen.  It was partly cystic and partly solid.  There is some blood flow seen within the solid areas.  No normal ovarian tissue was seen.    A Ca1 25 was drawn on May 02, 2018 and this was mildly elevated at 56.1 (upper limit of normal 38.1).  Patient is an otherwise very healthy woman.  Her she is never had abdominal surgeries.  She has had a history of 2 prior vaginal deliveries.  She has no family history for breast ovarian or colonic cancer.  She is a non-smoker.  CT abdo/pelvis on 05/05/18 was performed which showed a large pelvic mass with internal features concerning for malignancy but no apparent extrauterine disease.  Interval Hx:  On 05/08/18 she underwent robotic assisted total hysterectomy, BSO, omental biopsy and peritoneal  staging biopsies for a clinical stage IC mucinous adenocarcinoma of the left ovary. Intraoperative findings were significant for leakage of mucinous fluid from ovarian cyst (washings from this fluid/aspiration was negative for malignant cells). There was a 12cm left ovarian mass which was attached with filmy adhesions to the sigmoid colon and posterior uterus. The right ovary was grossly normal as was the uterus. There was no lymphadenopathy (lymphadenectomy not performed due to the mucinous cell type of cancer which is associated with <1% risk for lymph node metastases) and no peritoneal carcionomatosis/diaphragmatic lesions, or omental disease.   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 The primary tumor did not have surface involvement (other than peritoneal leakage was a stage IA).   She was determined to have low risk factors for recurrence (no surface involvement, negative washings/fluid, low grade lesion, mucinous cell type) and was recommended to have close follow-up without adjuvant therapy in accordance with NCCN guidelines.  Postoperatively she has done well with no complaints.   Current Meds:  Outpatient Encounter Medications as of 05/28/2018  Medication Sig  . b complex vitamins tablet Take 1 tablet by mouth 2 (two) times daily.  . cetirizine (ZYRTEC) 10 MG tablet Take 10 mg by mouth daily.  . chlorthalidone (HYGROTON) 50 MG tablet Take 25 mg by mouth daily.  Marland Kitchen  Cholecalciferol (VITAMIN D3) 125 MCG (5000 UT) CAPS Take 10,000 Units by mouth daily.  Marland Kitchen ESTROGEL 0.75 MG/1.25 GM (0.06%) topical gel Place 1.25 g onto the skin daily.   Marland Kitchen lisdexamfetamine (VYVANSE) 70 MG capsule Take 70 mg by mouth daily.  Marland Kitchen lisinopril-hydrochlorothiazide (PRINZIDE,ZESTORETIC) 20-12.5 MG tablet Take 1 tablet by mouth daily.   . Omega-3 Fatty Acids (OMEGA 3 PO) Take 1,800 mg by mouth daily.  Marland Kitchen OVER THE COUNTER  MEDICATION Take 250 mg by mouth 2 (two) times daily. Rehlora otc supplement powder twice a day.  . oxyCODONE-acetaminophen (PERCOCET/ROXICET) 5-325 MG tablet Take 1-2 tablets by mouth every 4 (four) hours as needed for severe pain.   No facility-administered encounter medications on file as of 05/28/2018.     Allergy: No Known Allergies  Social Hx:   Social History   Socioeconomic History  . Marital status: Married    Spouse name: Not on file  . Number of children: Not on file  . Years of education: Not on file  . Highest education level: Not on file  Occupational History  . Not on file  Social Needs  . Financial resource strain: Not on file  . Food insecurity:    Worry: Not on file    Inability: Not on file  . Transportation needs:    Medical: Not on file    Non-medical: Not on file  Tobacco Use  . Smoking status: Never Smoker  . Smokeless tobacco: Never Used  Substance and Sexual Activity  . Alcohol use: Yes    Alcohol/week: 2.0 standard drinks    Types: 1 Glasses of wine, 1 Cans of beer per week    Comment: Daily drinks  . Drug use: Never  . Sexual activity: Not Currently  Lifestyle  . Physical activity:    Days per week: Not on file    Minutes per session: Not on file  . Stress: Not on file  Relationships  . Social connections:    Talks on phone: Not on file    Gets together: Not on file    Attends religious service: Not on file    Active member of club or organization: Not on file    Attends meetings of clubs or organizations: Not on file    Relationship status: Not on file  . Intimate partner violence:    Fear of current or ex partner: Not on file    Emotionally abused: Not on file    Physically abused: Not on file    Forced sexual activity: Not on file  Other Topics Concern  . Not on file  Social History Narrative  . Not on file    Past Surgical Hx:  Past Surgical History:  Procedure Laterality Date  . BREAST BIOPSY Right    negative  . ROBOTIC  ASSISTED TOTAL HYSTERECTOMY WITH BILATERAL SALPINGO OOPHERECTOMY Bilateral 05/08/2018   Procedure: XI ROBOTIC ASSISTED TOTAL HYSTERECTOMY BILATERAL SALPINGO-OOPHORECTOMY,OMENTECTOMY WITH STAGING;  Surgeon: Everitt Amber, MD;  Location: [facility];  Service: Gynecology;  Laterality: Bilateral;  . ROTATOR CUFF REPAIR Right    12-19    Past Medical Hx:  Past Medical History:  Diagnosis Date  . Cancer ([place])   . Complication of anesthesia   . Hypertension   . PONV (postoperative nausea and vomiting)     Past Gynecological History:  SVD x 2 Patient's last menstrual period was 04/23/2010.  Family Hx:  Family History  Problem Relation Age of Onset  . Breast cancer Mother   .  Alzheimer's disease Mother     Review of Systems:  Constitutional  Feels well,    ENT Normal appearing ears and nares bilaterally Skin/Breast  No rash, sores, jaundice, itching, dryness Cardiovascular  No chest pain, shortness of breath, or edema  Pulmonary  No cough or wheeze.  Gastro Intestinal  No nausea, vomitting, or diarrhoea. No bright red blood per rectum, no abdominal pain, change in bowel movement, or constipation.  Genito Urinary  No frequency, urgency, dysuria, no bleeding, no bleeding Musculo Skeletal  No myalgia, arthralgia, joint swelling or pain  Neurologic  No weakness, numbness, change in gait,  Psychology  No depression, anxiety, insomnia.   Vitals:  Blood pressure 128/77, pulse 100, temperature 97.6 F (36.4 C), temperature source Oral, resp. rate 20, height 5\' 2"  (1.575 m), weight 125 lb (56.7 kg), last menstrual period 04/23/2010, SpO2 100 %.  Physical Exam: WD in NAD Neck  Supple NROM, without any enlargements.  Lymph Node Survey No cervical supraclavicular or inguinal adenopathy Cardiovascular  Pulse normal rate, regularity and rhythm. S1 and S2 normal.  Lungs  Clear to auscultation bilateraly, without wheezes/crackles/rhonchi. Good air movement.  Skin  No  rash/lesions/breakdown  Psychiatry  Alert and oriented to person, place, and time  Abdomen  Normoactive bowel sounds, abdomen soft, non-tender and thin without evidence of hernia. Incisions are well healed. Back No CVA tenderness Genito Urinary  Vaginal cuff in tact and healing normally.  Rectal  Good tone,  no cul de sac nodularity.  Extremities  No bilateral cyanosis, clubbing or edema.  30 minutes of direct face to face counseling time was spent with the patient. This included discussion about prognosis, therapy recommendations and postoperative side effects and are beyond the scope of routine postoperative care.   Thereasa Solo, MD  05/28/2018, 3:13 PM

## 2018-07-15 ENCOUNTER — Telehealth: Payer: Self-pay | Admitting: Genetic Counselor

## 2018-07-15 NOTE — Telephone Encounter (Signed)
Called patient regarding upcoming Webex appointment, left a voicemail and due to no communication to set up Webex this will be a walk-in visit.

## 2018-07-16 ENCOUNTER — Encounter: Payer: Self-pay | Admitting: Genetic Counselor

## 2018-07-16 ENCOUNTER — Other Ambulatory Visit: Payer: Self-pay | Admitting: Genetic Counselor

## 2018-07-16 ENCOUNTER — Other Ambulatory Visit: Payer: Self-pay

## 2018-07-16 ENCOUNTER — Inpatient Hospital Stay: Payer: 59 | Attending: Gynecologic Oncology | Admitting: Genetic Counselor

## 2018-07-16 ENCOUNTER — Inpatient Hospital Stay: Payer: 59

## 2018-07-16 DIAGNOSIS — C562 Malignant neoplasm of left ovary: Secondary | ICD-10-CM | POA: Diagnosis not present

## 2018-07-16 DIAGNOSIS — Z7183 Encounter for nonprocreative genetic counseling: Secondary | ICD-10-CM

## 2018-07-16 DIAGNOSIS — Z803 Family history of malignant neoplasm of breast: Secondary | ICD-10-CM | POA: Diagnosis not present

## 2018-07-16 NOTE — Progress Notes (Signed)
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 Family history of breast cancer      HISTORY OF PRESENT ILLNESS:   Sara Werner, a 58 y.o. female, was seen for a [facility] cancer genetics consultation at the request of Dr. Denman George due to a personal and family history of cancer.  Sara Werner presents to clinic today to discuss the possibility of a hereditary predisposition to cancer, genetic testing, and to further clarify her future cancer risks, as well as potential cancer risks for family members.   In March 2020, at the age of 69, Sara Werner was diagnosed with cancer of the left ovary. The treatment plan included a TAH-BSO, but no chemotherapy is prescribed.Marland Kitchen    CANCER HISTORY:   No history exists.     RISK FACTORS:  Menarche was at age 60.  First live birth at age 23.  OCP use for approximately 7 years.  Ovaries intact: no.  Hysterectomy: yes.  Menopausal status: postmenopausal.  HRT use: 5 years. Colonoscopy: yes; normal. Mammogram within the last year: yes. Number of breast biopsies: 1. Up to date with pelvic exams: yes. Any excessive radiation exposure in the past: no  Past Medical History:  Diagnosis Date  . Cancer ([place])   . Complication of anesthesia   . Family history of breast cancer   . Hypertension   . PONV (postoperative nausea and vomiting)     Past Surgical History:  Procedure Laterality Date  . BREAST BIOPSY Right    negative  . ROBOTIC ASSISTED TOTAL HYSTERECTOMY WITH BILATERAL SALPINGO OOPHERECTOMY Bilateral 05/08/2018   Procedure: XI ROBOTIC ASSISTED TOTAL HYSTERECTOMY BILATERAL SALPINGO-OOPHORECTOMY,OMENTECTOMY WITH STAGING;  Surgeon: Everitt Amber, MD;  Location: [facility];  Service: Gynecology;  Laterality: Bilateral;  . ROTATOR CUFF REPAIR Right    12-19    Social History   Socioeconomic History  .  Marital status: Married    Spouse name: Not on file  . Number of children: Not on file  . Years of education: Not on file  . Highest education level: Not on file  Occupational History  . Not on file  Social Needs  . Financial resource strain: Not on file  . Food insecurity:    Worry: Not on file    Inability: Not on file  . Transportation needs:    Medical: Not on file    Non-medical: Not on file  Tobacco Use  . Smoking status: Never Smoker  . Smokeless tobacco: Never Used  Substance and Sexual Activity  . Alcohol use: Yes    Alcohol/week: 2.0 standard drinks    Types: 1 Glasses of wine, 1 Cans of beer per week    Comment: Daily drinks  . Drug use: Never  . Sexual activity: Not Currently  Lifestyle  . Physical activity:    Days per week: Not on file    Minutes per session: Not on file  . Stress: Not on file  Relationships  . Social connections:    Talks on phone: Not on file    Gets together: Not on file    Attends religious service: Not on file    Active member of club or organization: Not on file    Attends meetings of clubs or organizations: Not on file    Relationship status: Not on file  Other Topics Concern  .  Not on file  Social History Narrative  . Not on file     FAMILY HISTORY:  We obtained a detailed, 4-generation family history.  Significant diagnoses are listed below: Family History  Problem Relation Age of Onset  . Breast cancer Mother 37  . Alzheimer's disease Mother   . Colon polyps Father     The patient has two daughters who are cancer free. She has two brothers and two sisters who are cancer free.  Her mother is deceased and her father is living.  Her mother had breast cancer at 53 and died of dementia at 2.  She had three sisters and four brothers who were cancer free. Both maternal grandparents died of non cancer related issues.  The patient's father has two living brothers and three deceased sisters, none have had cancer.  His parents died  of non cancer related issues.  Sara Werner is unaware of previous family history of genetic testing for hereditary cancer risks. Patient's maternal ancestors are of [place] and [place] descent, and paternal ancestors are of [place], [place], [place] and [place] descent. There is no reported Ashkenazi Jewish ancestry. There is no known consanguinity.  GENETIC COUNSELING ASSESSMENT: Sara Werner is a 58 y.o. female with a personal and family history of cancer which is somewhat suggestive of a hereditary cancer syndrome and predisposition to cancer. We, therefore, discussed and recommended the following at today's visit.   DISCUSSION: We discussed that 15 - 20% of ovarian cancer is hereditary, with most cases associated with BRCA mutations.  There are other genes that can be associated with hereditary ovarian cancer syndromes.  These include BRIP1, RAD51C, RAD51D, and the lynch syndrome genes.  We discussed that testing is beneficial for several reasons including knowing how to follow individuals after completing their treatment, identifying whether potential treatment options such as PARP inhibitors would be beneficial, and understand if other family members could be at risk for cancer and allow them to undergo genetic testing.   We reviewed the characteristics, features and inheritance patterns of hereditary cancer syndromes. We also discussed genetic testing, including the appropriate family members to test, the process of testing, insurance coverage and turn-around-time for results. We discussed the implications of a negative, positive and/or variant of uncertain significant result. We recommended Sara Werner pursue genetic testing for the CancerNext-HRD gene panel. The CancerNext gene panel offered by [employer] includes sequencing and rearrangement analysis for the following 34 genes:   APC, ATM, BARD1, BMPR1A, BRCA1, BRCA2, BRIP1, CDH1, CDK4, CDKN2A, CHEK2, DICER1, HOXB13, EPCAM, GREM1, MLH1, MRE11A, MSH2,  MSH6, MUTYH, NBN, NF1, PALB2, PMS2, POLD1, POLE, PTEN, RAD50, RAD51C, RAD51D, SMAD4, SMARCA4, STK11, and TP53.   Based on Sara Werner's personal and family history of cancer, she meets medical criteria for genetic testing. Despite that she meets criteria, she may still have an out of pocket cost. We discussed that if her out of pocket cost for testing is over $100, the laboratory will call and confirm whether she wants to proceed with testing.  If the out of pocket cost of testing is less than $100 she will be billed by the genetic testing laboratory.   PLAN: After considering the risks, benefits, and limitations, Sara Werner provided informed consent to pursue genetic testing and the blood sample was sent to [facility] for analysis of the TumorNext-HRD+CancerNext. Results should be available within approximately 2-3 weeks' time, at which point they will be disclosed by telephone to Sara Werner, as will any additional recommendations warranted by  these results. Sara Werner will receive a summary of her genetic counseling visit and a copy of her results once available. This information will also be available in Epic.   Lastly, we encouraged Sara Werner to remain in contact with cancer genetics annually so that we can continuously update the family history and inform her of any changes in cancer genetics and testing that may be of benefit for this family.   Sara Werner's questions were answered to her satisfaction today. Our contact information was provided should additional questions or concerns arise. Thank you for the referral and allowing [place] to share in the care of your patient.   Sara Werner P. Florene Glen, [place], [facility] Certified Genetic Counselor [email or web address] phone: [phone]  The patient was seen for a total of 45 minutes in face-to-face genetic counseling.  This patient was discussed with Drs. Magrinat, Lindi Adie and/or Burr Medico who agrees with the above.     _______________________________________________________________________ For Office Staff:  Number of people involved in session: 1 Was an Intern/ student involved with case: no

## 2018-08-04 ENCOUNTER — Encounter: Payer: Self-pay | Admitting: Genetic Counselor

## 2018-08-04 ENCOUNTER — Telehealth: Payer: Self-pay | Admitting: Genetic Counselor

## 2018-08-04 DIAGNOSIS — Z1379 Encounter for other screening for genetic and chromosomal anomalies: Secondary | ICD-10-CM | POA: Insufficient documentation

## 2018-08-04 NOTE — Telephone Encounter (Signed)
LM On VM that results are back and to please call.

## 2018-08-05 ENCOUNTER — Ambulatory Visit: Payer: Self-pay | Admitting: Genetic Counselor

## 2018-08-05 DIAGNOSIS — Z1379 Encounter for other screening for genetic and chromosomal anomalies: Secondary | ICD-10-CM

## 2018-08-05 NOTE — Progress Notes (Signed)
HPI:  Sara Werner was previously seen in the [facility] clinic due to a personal and family history of cancer and concerns regarding a hereditary predisposition to cancer. Please refer to our prior cancer genetics clinic note for more information regarding our discussion, assessment and recommendations, at the time. Sara Werner's recent genetic test results were disclosed to her, as were recommendations warranted by these results. These results and recommendations are discussed in more detail below.  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  The CancerNext gene panel offered by [employer] includes sequencing and rearrangement analysis for the following 34 genes:   APC, ATM, BARD1, BMPR1A, BRCA1, BRCA2, BRIP1, CDH1, CDK4, CDKN2A, CHEK2, DICER1, HOXB13, EPCAM, GREM1, MLH1, MRE11A, MSH2, MSH6, MUTYH, NBN, NF1, PALB2, PMS2, POLD1, POLE, PTEN, RAD50, RAD51C, RAD51D, SMAD4, SMARCA4, STK11, and TP53.     Somatic genes analyzed include: ATM, BARD1, BRIP1, CHEK2, MRE11A, NBN, PALB2, RAD51C, RAD51D, BRCA1 and BRCA2.  The report date is August 04, 2018.     FAMILY HISTORY:  We obtained a detailed, 4-generation family history.  Significant diagnoses are listed below: Family History  Problem Relation Age of Onset  . Breast cancer Mother 1  . Alzheimer's disease Mother   . Colon polyps Father     The patient has two daughters who are cancer free. She has two brothers and two sisters who are cancer free.  Her mother is deceased and her father is living.  Her mother had breast cancer at 36 and died of dementia at 13.  She had three sisters and four brothers who were cancer free. Both maternal grandparents died of non cancer related issues.  The patient's father has two living brothers and  three deceased sisters, none have had cancer.  His parents died of non cancer related issues.  Sara Werner is unaware of previous family history of genetic testing for hereditary cancer risks. Patient's maternal ancestors are of [place] and [place] descent, and paternal ancestors are of [place], [place], [place] and [place] descent. There is no reported Ashkenazi Jewish ancestry. There is no known consanguinity.   GENETIC TEST RESULTS: Genetic testing reported out on August 04, 2018,  through the TumorNext-HRD with CancerNext cancer panel found no pathogenic mutations. The CancerNext gene panel offered by [employer] includes sequencing and rearrangement analysis for the following 34 genes:   APC, ATM, BARD1, BMPR1A, BRCA1, BRCA2, BRIP1, CDH1, CDK4, CDKN2A, CHEK2, DICER1, HOXB13, EPCAM, GREM1, MLH1, MRE11A, MSH2, MSH6, MUTYH, NBN, NF1, PALB2, PMS2, POLD1, POLE, PTEN, RAD50, RAD51C, RAD51D, SMAD4, SMARCA4, STK11, and TP53.  The test report has been scanned into EPIC and is located under the Molecular Pathology section of the Results Review tab.  A portion of the result report is included below for reference.     We discussed with Sara Werner that because current genetic testing is not perfect, it is possible there may be a gene mutation in one of these genes that current testing cannot detect, but that chance is small.  We also discussed, that there could be another gene that has not yet been discovered, or that we have not yet tested, that is responsible for the cancer diagnoses in the family. It is also possible there is a hereditary cause for the cancer in the family that Sara Werner did not  inherit and therefore was not identified in her testing.  Therefore, it is important to remain in touch with cancer genetics in the future so that we can continue to offer Sara Werner the most up to date genetic testing.   The negative HRD testing is suggesting that the patient's tumor would be less responsive to PARP  inhibitors.  ADDITIONAL GENETIC TESTING: We discussed with Sara Werner that there are other genes that are associated with increased cancer risk that can be analyzed. Should Sara Werner wish to pursue additional genetic testing, we are happy to discuss and coordinate this testing, at any time.    CANCER SCREENING RECOMMENDATIONS: Sara Werner test result is considered negative (normal).  This means that we have not identified a hereditary cause for her personal and family history of cancer at this time. Most cancers happen by chance and this negative test suggests that her cancer may fall into this category.    While reassuring, this does not definitively rule out a hereditary predisposition to cancer. It is still possible that there could be genetic mutations that are undetectable by current technology. There could be genetic mutations in genes that have not been tested or identified to increase cancer risk.  Therefore, it is recommended she continue to follow the cancer management and screening guidelines provided by her oncology and primary healthcare provider.   An individual's cancer risk and medical management are not determined by genetic test results alone. Overall cancer risk assessment incorporates additional factors, including personal medical history, family history, and any available genetic information that may result in a personalized plan for cancer prevention and surveillance  RECOMMENDATIONS FOR FAMILY MEMBERS:  Individuals in this family might be at some increased risk of developing cancer, over the general population risk, simply due to the family history of cancer.  We recommended women in this family have a yearly mammogram beginning at age 17, or 55 years younger than the earliest onset of cancer, an annual clinical breast exam, and perform monthly breast self-exams. Women in this family should also have a gynecological exam as recommended by their primary provider. All family members  should have a colonoscopy by age 74.  FOLLOW-UP: Lastly, we discussed with Sara Werner that cancer genetics is a rapidly advancing field and it is possible that new genetic tests will be appropriate for her and/or her family members in the future. We encouraged her to remain in contact with cancer genetics on an annual basis so we can update her personal and family histories and let her know of advances in cancer genetics that may benefit this family.   Our contact number was provided. Sara Werner's questions were answered to her satisfaction, and she knows she is welcome to call us at anytime with additional questions or concerns.   Roma Kayser, MS, [facility] Certified Genetic Counselor [email or web address]

## 2018-08-08 NOTE — Telephone Encounter (Signed)
Revealed negative genetic testing.  Discussed that we do not know why she has ovarian cancer or why there is cancer in the family. It could be due to a different gene that we are not testing, or maybe our current technology may not be able to pick something up.  It will be important for her to keep in contact with genetics to keep up with whether additional testing may be needed.      

## 2018-08-22 ENCOUNTER — Telehealth: Payer: Self-pay | Admitting: *Deleted

## 2018-08-22 NOTE — Telephone Encounter (Signed)
Patient called and scheduled a follow up appt to see Dr. Denman George

## 2018-10-15 ENCOUNTER — Inpatient Hospital Stay: Payer: 59

## 2018-10-15 ENCOUNTER — Other Ambulatory Visit: Payer: Self-pay

## 2018-10-15 ENCOUNTER — Inpatient Hospital Stay: Payer: 59 | Attending: Gynecologic Oncology | Admitting: Gynecologic Oncology

## 2018-10-15 VITALS — BP 130/75 | HR 85 | Temp 98.7°F | Resp 17 | Ht 62.0 in | Wt 125.9 lb

## 2018-10-15 DIAGNOSIS — C562 Malignant neoplasm of left ovary: Secondary | ICD-10-CM | POA: Diagnosis present

## 2018-10-15 DIAGNOSIS — Z90710 Acquired absence of both cervix and uterus: Secondary | ICD-10-CM | POA: Insufficient documentation

## 2018-10-15 DIAGNOSIS — Z79899 Other long term (current) drug therapy: Secondary | ICD-10-CM | POA: Insufficient documentation

## 2018-10-15 DIAGNOSIS — Z90722 Acquired absence of ovaries, bilateral: Secondary | ICD-10-CM | POA: Diagnosis not present

## 2018-10-15 LAB — CEA (IN HOUSE-CHCC): CEA (CHCC-In House): 2.99 ng/mL (ref 0.00–5.00)

## 2018-10-15 NOTE — Patient Instructions (Signed)
There is no sign of cancer recurrence on today's exam.  Dr Denman George has ordered tumor markers for you today. Her office will call you with the results.  Please notify Dr Denman George at phone number [phone] if you notice vaginal bleeding, new pelvic or abdominal pains, bloating, feeling full easy, or a change in bladder or bowel function.   Please contact Dr Serita Grit office (at [phone]) in January, 2021 to request an appointment with her for March, 2021.

## 2018-10-15 NOTE — Progress Notes (Signed)
Follow-up Note: Gyn-Onc  Consult was initially requested by Dr. Nori Riis for the evaluation of Sara Werner 58 y.o. female  CC:  Chief Complaint  Patient presents with  . Ovarian Cancer    follow-up    Assessment/Plan:  Sara Werner  is a 58 y.o.  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 BRCA negative, HR proficient. No adjuvant therapy recommended due to low risk factors for recurrence.  Recommend 6 monthly follow-up with pelvic examination and tumor marker (CA 125 and CEA assessment).   HPI: Sara Werner is a 58 year old woman who is seen in consultation at the request of Dr Nori Riis for a left ovarian mass and elevated CA 125.  The patient had an episode of straining to have a bowel movement approximately 1 week ago in March 2020.  She then felt some pelvic pressure.  She returned to [facility] and reported this to her OB/GYN GYN who performed a transvaginal ultrasound scan on May 01, 2018.  This revealed a uterus measuring 8 x 4 x 5 cm with an endometrium of 6 mm.  The right ovary was not visualized.  The left adnexa revealed a 10.6 x 6.5 x 9.8 cm complex mass seen.  It was partly cystic and partly solid.  There is some blood flow seen within the solid areas.  No normal ovarian tissue was seen.    A Ca1 25 was drawn on May 02, 2018 and this was mildly elevated at 56.1 (upper limit of normal 38.1).  Patient is an otherwise very healthy woman.  Her she is never had abdominal surgeries.  She has had a history of 2 prior vaginal deliveries.  She has no family history for breast ovarian or colonic cancer.  She is a non-smoker.  CT abdo/pelvis on 05/05/18 was performed which showed a large pelvic mass with internal features concerning for malignancy but no apparent extrauterine disease.  On 05/08/18 she underwent robotic assisted total hysterectomy, BSO, omental biopsy and peritoneal staging biopsies for a  clinical stage IC mucinous adenocarcinoma of the left ovary. Intraoperative findings were significant for leakage of mucinous fluid from ovarian cyst (washings from this fluid/aspiration was negative for malignant cells). There was a 12cm left ovarian mass which was attached with filmy adhesions to the sigmoid colon and posterior uterus. The right ovary was grossly normal as was the uterus. There was no lymphadenopathy (lymphadenectomy not performed due to the mucinous cell type of cancer which is associated with <1% risk for lymph node metastases) and no peritoneal carcionomatosis/diaphragmatic lesions, or omental disease.   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 The primary tumor did not have surface involvement (other than peritoneal leakage was a stage IA).   She was determined to have low risk factors for recurrence (no surface involvement, negative washings/fluid, low grade lesion, mucinous cell type) and was recommended to have close follow-up without adjuvant therapy in accordance with NCCN guidelines.  Germline and somatic genetic testing was negative, including for BRCA.  Interval Hx:  She has done well since surgery. She feels that she may have a hernia in the left lateral port site but it is not bothersome.  CA125 was drawn on 10/15/18.   Current Meds:  Outpatient Encounter Medications as of 10/15/2018  Medication Sig  . b complex vitamins tablet Take 1  tablet by mouth 2 (two) times daily.  . cetirizine (ZYRTEC) 10 MG tablet Take 10 mg by mouth daily.  . chlorthalidone (HYGROTON) 50 MG tablet Take 25 mg by mouth daily.  . Cholecalciferol (VITAMIN D3) 125 MCG (5000 UT) CAPS Take 10,000 Units by mouth daily.  Marland Kitchen ESTROGEL 0.75 MG/1.25 GM (0.06%) topical gel Place 1.25 g onto the skin daily.   Marland Kitchen lisdexamfetamine (VYVANSE) 70 MG capsule Take 70 mg by mouth daily.  Marland Kitchen  lisinopril-hydrochlorothiazide (PRINZIDE,ZESTORETIC) 20-12.5 MG tablet Take 1 tablet by mouth daily.   . Omega-3 Fatty Acids (OMEGA 3 PO) Take 1,800 mg by mouth daily.  Marland Kitchen OVER THE COUNTER MEDICATION Take 250 mg by mouth 2 (two) times daily. Rehlora otc supplement powder twice a day.  . [DISCONTINUED] oxyCODONE-acetaminophen (PERCOCET/ROXICET) 5-325 MG tablet Take 1-2 tablets by mouth every 4 (four) hours as needed for severe pain.   No facility-administered encounter medications on file as of 10/15/2018.     Allergy: No Known Allergies  Social Hx:   Social History   Socioeconomic History  . Marital status: Married    Spouse name: Not on file  . Number of children: Not on file  . Years of education: Not on file  . Highest education level: Not on file  Occupational History  . Not on file  Social Needs  . Financial resource strain: Not on file  . Food insecurity    Worry: Not on file    Inability: Not on file  . Transportation needs    Medical: Not on file    Non-medical: Not on file  Tobacco Use  . Smoking status: Never Smoker  . Smokeless tobacco: Never Used  Substance and Sexual Activity  . Alcohol use: Yes    Alcohol/week: 2.0 standard drinks    Types: 1 Glasses of wine, 1 Cans of beer per week    Comment: Daily drinks  . Drug use: Never  . Sexual activity: Not Currently  Lifestyle  . Physical activity    Days per week: Not on file    Minutes per session: Not on file  . Stress: Not on file  Relationships  . Social Herbalist on phone: Not on file    Gets together: Not on file    Attends religious service: Not on file    Active member of club or organization: Not on file    Attends meetings of clubs or organizations: Not on file    Relationship status: Not on file  . Intimate partner violence    Fear of current or ex partner: Not on file    Emotionally abused: Not on file    Physically abused: Not on file    Forced sexual activity: Not on file  Other  Topics Concern  . Not on file  Social History Narrative  . Not on file    Past Surgical Hx:  Past Surgical History:  Procedure Laterality Date  . BREAST BIOPSY Right    negative  . ROBOTIC ASSISTED TOTAL HYSTERECTOMY WITH BILATERAL SALPINGO OOPHERECTOMY Bilateral 05/08/2018   Procedure: XI ROBOTIC ASSISTED TOTAL HYSTERECTOMY BILATERAL SALPINGO-OOPHORECTOMY,OMENTECTOMY WITH STAGING;  Surgeon: Everitt Amber, MD;  Location: [facility];  Service: Gynecology;  Laterality: Bilateral;  . ROTATOR CUFF REPAIR Right    12-19    Past Medical Hx:  Past Medical History:  Diagnosis Date  . Cancer ([place])   . Complication of anesthesia   . Family history of breast cancer   . Hypertension   .  PONV (postoperative nausea and vomiting)     Past Gynecological History:  SVD x 2 Patient's last menstrual period was 04/23/2010.  Family Hx:  Family History  Problem Relation Age of Onset  . Breast cancer Mother 34  . Alzheimer's disease Mother   . Colon polyps Father     Review of Systems:  Constitutional  Feels well,    ENT Normal appearing ears and nares bilaterally Skin/Breast  No rash, sores, jaundice, itching, dryness Cardiovascular  No chest pain, shortness of breath, or edema  Pulmonary  No cough or wheeze.  Gastro Intestinal  No nausea, vomitting, or diarrhoea. No bright red blood per rectum, no abdominal pain, change in bowel movement, or constipation.  Genito Urinary  No frequency, urgency, dysuria, no bleeding, no bleeding Musculo Skeletal  No myalgia, arthralgia, joint swelling or pain  Neurologic  No weakness, numbness, change in gait,  Psychology  No depression, anxiety, insomnia.   Vitals:  Blood pressure 130/75, pulse 85, temperature 98.7 F (37.1 C), temperature source Oral, resp. rate 17, height '5\' 2"'  (1.575 m), weight 125 lb 14.4 oz (57.1 kg), last menstrual period 04/23/2010, SpO2 100 %.  Physical Exam: WD in NAD Neck  Supple NROM, without any enlargements.  Lymph  Node Survey No cervical supraclavicular or inguinal adenopathy Cardiovascular  Pulse normal rate, regularity and rhythm. S1 and S2 normal.  Lungs  Clear to auscultation bilateraly, without wheezes/crackles/rhonchi. Good air movement.  Skin  No rash/lesions/breakdown  Psychiatry  Alert and oriented to person, place, and time  Abdomen  Normoactive bowel sounds, abdomen soft, non-tender and thin without evidence of hernia. Incisions are soft. Back No CVA tenderness Genito Urinary  Vaginal cuff smooth with no palpable masses beyond. Rectal  Good tone,  no cul de sac nodularity.  Extremities  No bilateral cyanosis, clubbing or edema.  Thereasa Solo, MD  10/15/2018, 2:41 PM

## 2018-10-16 ENCOUNTER — Telehealth: Payer: Self-pay

## 2018-10-16 LAB — CA 125: Cancer Antigen (CA) 125: 8.5 U/mL (ref 0.0–38.1)

## 2018-10-16 NOTE — Telephone Encounter (Signed)
I spoke with Sara Werner and told her that her CEA and CA 125 levels were within normal limits, normal. She verbalized understanding.

## 2018-12-15 ENCOUNTER — Other Ambulatory Visit: Payer: Self-pay | Admitting: Obstetrics & Gynecology

## 2018-12-15 DIAGNOSIS — Z1231 Encounter for screening mammogram for malignant neoplasm of breast: Secondary | ICD-10-CM

## 2019-02-03 ENCOUNTER — Ambulatory Visit
Admission: RE | Admit: 2019-02-03 | Discharge: 2019-02-03 | Disposition: A | Discharge status: Home / Self Care | Payer: 59 | Source: Ambulatory Visit | Attending: Obstetrics & Gynecology | Admitting: Obstetrics & Gynecology

## 2019-02-03 ENCOUNTER — Other Ambulatory Visit: Payer: Self-pay

## 2019-02-03 DIAGNOSIS — Z1231 Encounter for screening mammogram for malignant neoplasm of breast: Secondary | ICD-10-CM

## 2019-02-16 ENCOUNTER — Telehealth: Payer: Self-pay | Admitting: *Deleted

## 2019-02-16 NOTE — Telephone Encounter (Signed)
Patient called and scheduled MD/lab appt in March

## 2019-04-27 ENCOUNTER — Inpatient Hospital Stay: Payer: 59 | Attending: Gynecologic Oncology

## 2019-04-27 ENCOUNTER — Other Ambulatory Visit: Payer: Self-pay

## 2019-04-27 DIAGNOSIS — Z90722 Acquired absence of ovaries, bilateral: Secondary | ICD-10-CM | POA: Insufficient documentation

## 2019-04-27 DIAGNOSIS — I10 Essential (primary) hypertension: Secondary | ICD-10-CM | POA: Diagnosis not present

## 2019-04-27 DIAGNOSIS — Z79899 Other long term (current) drug therapy: Secondary | ICD-10-CM | POA: Insufficient documentation

## 2019-04-27 DIAGNOSIS — Z803 Family history of malignant neoplasm of breast: Secondary | ICD-10-CM | POA: Diagnosis not present

## 2019-04-27 DIAGNOSIS — C562 Malignant neoplasm of left ovary: Secondary | ICD-10-CM

## 2019-04-27 DIAGNOSIS — Z8543 Personal history of malignant neoplasm of ovary: Secondary | ICD-10-CM | POA: Diagnosis present

## 2019-04-27 DIAGNOSIS — Z90710 Acquired absence of both cervix and uterus: Secondary | ICD-10-CM | POA: Insufficient documentation

## 2019-04-27 DIAGNOSIS — Z9079 Acquired absence of other genital organ(s): Secondary | ICD-10-CM | POA: Diagnosis not present

## 2019-04-27 LAB — CEA (IN HOUSE-CHCC): CEA (CHCC-In House): 3.07 ng/mL (ref 0.00–5.00)

## 2019-04-28 ENCOUNTER — Encounter: Payer: Self-pay | Admitting: Gynecologic Oncology

## 2019-04-28 ENCOUNTER — Inpatient Hospital Stay (HOSPITAL_BASED_OUTPATIENT_CLINIC_OR_DEPARTMENT_OTHER): Payer: 59 | Admitting: Gynecologic Oncology

## 2019-04-28 ENCOUNTER — Other Ambulatory Visit: Payer: Self-pay

## 2019-04-28 ENCOUNTER — Ambulatory Visit: Payer: 59

## 2019-04-28 VITALS — BP 146/89 | HR 84 | Temp 98.7°F | Resp 18 | Ht 62.0 in | Wt 131.8 lb

## 2019-04-28 DIAGNOSIS — C562 Malignant neoplasm of left ovary: Secondary | ICD-10-CM

## 2019-04-28 DIAGNOSIS — Z8543 Personal history of malignant neoplasm of ovary: Secondary | ICD-10-CM | POA: Diagnosis not present

## 2019-04-28 LAB — CA 125: Cancer Antigen (CA) 125: 12 U/mL (ref 0.0–38.1)

## 2019-04-28 NOTE — Progress Notes (Signed)
Follow-up Note: Gyn-Onc  Consult was initially requested by Dr. Nori Riis for the evaluation of Sara Werner 59 y.o. female  CC:  Chief Complaint  Patient presents with  . Left ovarian epithelial cancer (HCC)    Follow up    Assessment/Plan:  Sara Werner  is a 59 y.o.  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 BRCA negative, HR proficient. No adjuvant therapy recommended due to low risk factors for recurrence.  Recommend 6 monthly follow-up with pelvic examination and tumor marker (CA 125 and CEA assessment).   HPI: [name] is a 59 year old woman who is seen in consultation at the request of Dr Nori Riis for a left ovarian mass and elevated CA 125.  The patient had an episode of straining to have a bowel movement approximately 1 week ago in March 2020.  She then felt some pelvic pressure.  She returned to [facility] and reported this to her OB/GYN GYN who performed a transvaginal ultrasound scan on May 01, 2018.  This revealed a uterus measuring 8 x 4 x 5 cm with an endometrium of 6 mm.  The right ovary was not visualized.  The left adnexa revealed a 10.6 x 6.5 x 9.8 cm complex mass seen.  It was partly cystic and partly solid.  There is some blood flow seen within the solid areas.  No normal ovarian tissue was seen.    A Ca1 25 was drawn on May 02, 2018 and this was mildly elevated at 56.1 (upper limit of normal 38.1).  Patient is an otherwise very healthy woman.  Her she is never had abdominal surgeries.  She has had a history of 2 prior vaginal deliveries.  She has no family history for breast ovarian or colonic cancer.  She is a non-smoker.  CT abdo/pelvis on 05/05/18 was performed which showed a large pelvic mass with internal features concerning for malignancy but no apparent extrauterine disease.  On 05/08/18 she underwent robotic assisted total hysterectomy, BSO, omental biopsy and peritoneal  staging biopsies for a clinical stage IC mucinous adenocarcinoma of the left ovary. Intraoperative findings were significant for leakage of mucinous fluid from ovarian cyst (washings from this fluid/aspiration was negative for malignant cells). There was a 12cm left ovarian mass which was attached with filmy adhesions to the sigmoid colon and posterior uterus. The right ovary was grossly normal as was the uterus. There was no lymphadenopathy (lymphadenectomy not performed due to the mucinous cell type of cancer which is associated with <1% risk for lymph node metastases) and no peritoneal carcionomatosis/diaphragmatic lesions, or omental disease.   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 The primary tumor did not have surface involvement (other than peritoneal leakage was a stage IA).   She was determined to have low risk factors for recurrence (no surface involvement, negative washings/fluid, low grade lesion, mucinous cell type) and was recommended to have close follow-up without adjuvant therapy in accordance with NCCN guidelines.  Germline and somatic genetic testing was negative, including for BRCA.  Interval Hx:  She has done well since surgery. CA125 was drawn on 04/27/19 and was normal at 12, and CEA on 04/27/19 was normal at 2.99.   Current Meds:  Outpatient Encounter Medications as of 04/28/2019  Medication Sig  . b complex vitamins tablet Take 1 tablet by mouth  2 (two) times daily.  . cetirizine (ZYRTEC) 10 MG tablet Take 10 mg by mouth daily.  . chlorthalidone (HYGROTON) 50 MG tablet Take 25 mg by mouth daily.  . Cholecalciferol (VITAMIN D3) 125 MCG (5000 UT) CAPS Take 10,000 Units by mouth daily.  Marland Kitchen ESTROGEL 0.75 MG/1.25 GM (0.06%) topical gel Place 1.25 g onto the skin daily.   Marland Kitchen lisdexamfetamine (VYVANSE) 70 MG capsule Take 70 mg by mouth daily.  Marland Kitchen lisinopril-hydrochlorothiazide  (PRINZIDE,ZESTORETIC) 20-12.5 MG tablet Take 1 tablet by mouth daily.   . Omega-3 Fatty Acids (OMEGA 3 PO) Take 1,800 mg by mouth daily.  Marland Kitchen OVER THE COUNTER MEDICATION Take 250 mg by mouth 2 (two) times daily. Rehlora otc supplement powder twice a day.   No facility-administered encounter medications on file as of 04/28/2019.    Allergy: No Known Allergies  Social Hx:   Social History   Socioeconomic History  . Marital status: Married    Spouse name: Not on file  . Number of children: Not on file  . Years of education: Not on file  . Highest education level: Not on file  Occupational History  . Not on file  Tobacco Use  . Smoking status: Never Smoker  . Smokeless tobacco: Never Used  Substance and Sexual Activity  . Alcohol use: Yes    Alcohol/week: 2.0 standard drinks    Types: 1 Glasses of wine, 1 Cans of beer per week    Comment: Daily drinks  . Drug use: Never  . Sexual activity: Not Currently  Other Topics Concern  . Not on file  Social History Narrative  . Not on file   Social Determinants of Health   Financial Resource Strain:   . Difficulty of Paying Living Expenses:   Food Insecurity:   . Worried About Charity fundraiser in the Last Year:   . Arboriculturist in the Last Year:   Transportation Needs:   . Film/video editor (Medical):   Marland Kitchen Lack of Transportation (Non-Medical):   Physical Activity:   . Days of Exercise per Week:   . Minutes of Exercise per Session:   Stress:   . Feeling of Stress :   Social Connections:   . Frequency of Communication with Friends and Family:   . Frequency of Social Gatherings with Friends and Family:   . Attends Religious Services:   . Active Member of Clubs or Organizations:   . Attends Archivist Meetings:   Marland Kitchen Marital Status:   Intimate Partner Violence:   . Fear of Current or Ex-Partner:   . Emotionally Abused:   Marland Kitchen Physically Abused:   . Sexually Abused:     Past Surgical Hx:  Past Surgical  History:  Procedure Laterality Date  . BREAST BIOPSY Right    negative  . ROBOTIC ASSISTED TOTAL HYSTERECTOMY WITH BILATERAL SALPINGO OOPHERECTOMY Bilateral 05/08/2018   Procedure: XI ROBOTIC ASSISTED TOTAL HYSTERECTOMY BILATERAL SALPINGO-OOPHORECTOMY,OMENTECTOMY WITH STAGING;  Surgeon: Everitt Amber, MD;  Location: [facility];  Service: Gynecology;  Laterality: Bilateral;  . ROTATOR CUFF REPAIR Right    12-19    Past Medical Hx:  Past Medical History:  Diagnosis Date  . Cancer ([place])   . Complication of anesthesia   . Family history of breast cancer   . Hypertension   . PONV (postoperative nausea and vomiting)     Past Gynecological History:  SVD x 2 Patient's last menstrual period was 04/23/2010.  Family Hx:  Family History  Problem  Relation Age of Onset  . Breast cancer Mother 26  . Alzheimer's disease Mother   . Colon polyps Father     Review of Systems:  Constitutional  Feels well,    ENT Normal appearing ears and nares bilaterally Skin/Breast  No rash, sores, jaundice, itching, dryness Cardiovascular  No chest pain, shortness of breath, or edema  Pulmonary  No cough or wheeze.  Gastro Intestinal  No nausea, vomitting, or diarrhoea. No bright red blood per rectum, no abdominal pain, change in bowel movement, or constipation.  Genito Urinary  No frequency, urgency, dysuria, no bleeding, no bleeding Musculo Skeletal  No myalgia, arthralgia, joint swelling or pain  Neurologic  No weakness, numbness, change in gait,  Psychology  No depression, anxiety, insomnia.   Vitals:  Blood pressure (!) 146/89, pulse 84, temperature 98.7 F (37.1 C), temperature source Temporal, resp. rate 18, height '5\' 2"'  (1.575 m), weight 131 lb 12.8 oz (59.8 kg), last menstrual period 04/23/2010, SpO2 100 %.  Physical Exam: WD in NAD Neck  Supple NROM, without any enlargements.  Lymph Node Survey No cervical supraclavicular or inguinal adenopathy Cardiovascular  Pulse normal rate,  regularity and rhythm. S1 and S2 normal.  Lungs  Clear to auscultation bilateraly, without wheezes/crackles/rhonchi. Good air movement.  Skin  No rash/lesions/breakdown  Psychiatry  Alert and oriented to person, place, and time  Abdomen  Normoactive bowel sounds, abdomen soft, non-tender and thin without evidence of hernia. Incisions are soft. Back No CVA tenderness Genito Urinary  Vaginal cuff smooth with no palpable masses beyond. Rectal  Good tone,  no cul de sac nodularity.  Extremities  No bilateral cyanosis, clubbing or edema.  Thereasa Solo, MD  04/28/2019, 3:19 PM

## 2019-04-28 NOTE — Patient Instructions (Signed)
Please notify Dr Denman George at phone number [phone] if you notice vaginal bleeding, new pelvic or abdominal pains, bloating, feeling full easy, or a change in bladder or bowel function.   Please contact Dr Serita Grit office (at [phone]) in June, 2021 to request an appointment with her for September, 2021. Please mention that you need labs drawn either the same day or the day before (at your preference).

## 2019-05-01 ENCOUNTER — Telehealth: Payer: Self-pay | Admitting: *Deleted

## 2019-05-01 NOTE — Telephone Encounter (Signed)
Per request from [facility] faxed last office note, cytology and pathology to Dr Nori Riis and Dr Melford Aase

## 2019-08-24 ENCOUNTER — Telehealth: Payer: Self-pay | Admitting: *Deleted

## 2019-08-24 NOTE — Telephone Encounter (Signed)
Patient called and scheduled her follow up for September

## 2019-11-09 ENCOUNTER — Inpatient Hospital Stay: Payer: 59 | Attending: Gynecologic Oncology

## 2019-11-09 ENCOUNTER — Other Ambulatory Visit: Payer: Self-pay

## 2019-11-09 DIAGNOSIS — Z803 Family history of malignant neoplasm of breast: Secondary | ICD-10-CM | POA: Insufficient documentation

## 2019-11-09 DIAGNOSIS — I10 Essential (primary) hypertension: Secondary | ICD-10-CM | POA: Diagnosis not present

## 2019-11-09 DIAGNOSIS — Z8543 Personal history of malignant neoplasm of ovary: Secondary | ICD-10-CM | POA: Insufficient documentation

## 2019-11-09 DIAGNOSIS — Z90710 Acquired absence of both cervix and uterus: Secondary | ICD-10-CM | POA: Diagnosis not present

## 2019-11-09 DIAGNOSIS — Z90722 Acquired absence of ovaries, bilateral: Secondary | ICD-10-CM | POA: Insufficient documentation

## 2019-11-09 DIAGNOSIS — K439 Ventral hernia without obstruction or gangrene: Secondary | ICD-10-CM | POA: Insufficient documentation

## 2019-11-09 DIAGNOSIS — Z79899 Other long term (current) drug therapy: Secondary | ICD-10-CM | POA: Insufficient documentation

## 2019-11-09 DIAGNOSIS — Z9079 Acquired absence of other genital organ(s): Secondary | ICD-10-CM | POA: Insufficient documentation

## 2019-11-09 DIAGNOSIS — C562 Malignant neoplasm of left ovary: Secondary | ICD-10-CM

## 2019-11-09 LAB — CEA (IN HOUSE-CHCC): CEA (CHCC-In House): 3.03 ng/mL (ref 0.00–5.00)

## 2019-11-10 ENCOUNTER — Telehealth: Payer: Self-pay | Admitting: *Deleted

## 2019-11-10 ENCOUNTER — Encounter: Payer: Self-pay | Admitting: Gynecologic Oncology

## 2019-11-10 ENCOUNTER — Other Ambulatory Visit: Payer: Self-pay

## 2019-11-10 ENCOUNTER — Inpatient Hospital Stay (HOSPITAL_BASED_OUTPATIENT_CLINIC_OR_DEPARTMENT_OTHER): Payer: 59 | Admitting: Gynecologic Oncology

## 2019-11-10 VITALS — BP 125/80 | HR 83 | Temp 98.0°F | Resp 18 | Wt 128.6 lb

## 2019-11-10 DIAGNOSIS — K439 Ventral hernia without obstruction or gangrene: Secondary | ICD-10-CM

## 2019-11-10 DIAGNOSIS — Z803 Family history of malignant neoplasm of breast: Secondary | ICD-10-CM

## 2019-11-10 DIAGNOSIS — I10 Essential (primary) hypertension: Secondary | ICD-10-CM

## 2019-11-10 DIAGNOSIS — C562 Malignant neoplasm of left ovary: Secondary | ICD-10-CM

## 2019-11-10 DIAGNOSIS — Z90722 Acquired absence of ovaries, bilateral: Secondary | ICD-10-CM

## 2019-11-10 DIAGNOSIS — Z8543 Personal history of malignant neoplasm of ovary: Secondary | ICD-10-CM

## 2019-11-10 DIAGNOSIS — Z9079 Acquired absence of other genital organ(s): Secondary | ICD-10-CM

## 2019-11-10 DIAGNOSIS — Z79899 Other long term (current) drug therapy: Secondary | ICD-10-CM

## 2019-11-10 DIAGNOSIS — Z90710 Acquired absence of both cervix and uterus: Secondary | ICD-10-CM

## 2019-11-10 LAB — CA 125: Cancer Antigen (CA) 125: 8.2 U/mL (ref 0.0–38.1)

## 2019-11-10 NOTE — Telephone Encounter (Signed)
Fax office records and referral sheet to [facility]

## 2019-11-10 NOTE — Patient Instructions (Signed)
Dr Denman George has ordered a CT scan to better evaluate the extent of your abdominal wall hernia. She will have you seen by one of the hernia specialists at [facility].  Please notify Dr Denman George at phone number [phone] if you notice vaginal bleeding, new pelvic or abdominal pains, bloating, feeling full easy, or a change in bladder or bowel function.   Please contact Dr Serita Grit office (at [phone]) in December to request an appointment with her for March, 2022. Please request a lab appointment the day before that visit for your tumor marker and blood chemistry work.

## 2019-11-10 NOTE — Progress Notes (Signed)
Follow-up Note: Gyn-Onc  Consult was initially requested by Dr. Nori Riis for the evaluation of Sara Werner 59 y.o. female  CC:  Chief Complaint  Patient presents with   Ovarian Cancer    follow up    Assessment/Plan:  Sara Werner  is a 59 y.o.  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 BRCA negative, HR proficient. No adjuvant therapy recommended due to low risk factors for recurrence. Recommend 6 monthly follow-up with pelvic examination and tumor marker (CA 125 and CEA assessment). Patient requested chemistry evaluation at that time due to new BP meds.  She has a palpable left lateral abdominal wall ventral hernia underlying the port site. She will have a CT abd/pelvis to ensure this is a hernia (and not port site recurrence). I will make a referral to [facility] for evaluation and consideration of hernia repair given that this is bothersome and symptomatic.   HPI: [name] is a 59 year old woman who is seen in consultation at the request of Dr Nori Riis for a left ovarian mass and elevated CA 125.  The patient had an episode of straining to have a bowel movement approximately 1 week ago in March 2020.  She then felt some pelvic pressure.  She returned to [facility] and reported this to her OB/GYN GYN who performed a transvaginal ultrasound scan on May 01, 2018.  This revealed a uterus measuring 8 x 4 x 5 cm with an endometrium of 6 mm.  The right ovary was not visualized.  The left adnexa revealed a 10.6 x 6.5 x 9.8 cm complex mass seen.  It was partly cystic and partly solid.  There is some blood flow seen within the solid areas.  No normal ovarian tissue was seen.    A Ca1 25 was drawn on May 02, 2018 and this was mildly elevated at 56.1 (upper limit of normal 38.1).  Patient is an otherwise very healthy woman.  Her she is never had abdominal surgeries.  She has had a history of 2  prior vaginal deliveries.  She has no family history for breast ovarian or colonic cancer.  She is a non-smoker.  CT abdo/pelvis on 05/05/18 was performed which showed a large pelvic mass with internal features concerning for malignancy but no apparent extrauterine disease.  On 05/08/18 she underwent robotic assisted total hysterectomy, BSO, omental biopsy and peritoneal staging biopsies for a clinical stage IC mucinous adenocarcinoma of the left ovary. Intraoperative findings were significant for leakage of mucinous fluid from ovarian cyst (washings from this fluid/aspiration was negative for malignant cells). There was a 12cm left ovarian mass which was attached with filmy adhesions to the sigmoid colon and posterior uterus. The right ovary was grossly normal as was the uterus. There was no lymphadenopathy (lymphadenectomy not performed due to the mucinous cell type of cancer which is associated with <1% risk for lymph node metastases) and no peritoneal carcionomatosis/diaphragmatic lesions, or omental disease.   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 The primary tumor did not have surface involvement (other than peritoneal leakage was a stage IA).   She was determined to have low risk factors for recurrence (no surface involvement, negative washings/fluid, low grade lesion, mucinous cell type) and was recommended to have close follow-up without adjuvant therapy in accordance with NCCN guidelines.  Germline and somatic genetic testing was negative, including for BRCA.  Interval Hx:  She has done well since surgery. CA125 was drawn on 04/27/19 and was normal at 12, and CEA on 04/27/19 was normal at 2.99.  CA 125 was drawn on 11/09/19 and was normal at 8.2 and CEA on 11/09/19 was normal at 3.03.  She reported a bothersome left lateral abdominal wall "lump" underlying a port site. It is reducible. It is  not painful. It is worse with standing and straining. It is bothersome to her and she is interested in an intervention to alleviate it.   Current Meds:  Outpatient Encounter Medications as of 11/10/2019  Medication Sig   ADDERALL XR 30 MG 24 hr capsule Take 30 mg by mouth daily in the afternoon.   b complex vitamins tablet Take 1 tablet by mouth 2 (two) times daily.   cetirizine (ZYRTEC) 10 MG tablet Take 10 mg by mouth daily.   chlorthalidone (HYGROTON) 50 MG tablet Take 25 mg by mouth daily.   Cholecalciferol (VITAMIN D3) 125 MCG (5000 UT) CAPS Take 10,000 Units by mouth daily.   ESTROGEL 0.75 MG/1.25 GM (0.06%) topical gel Place 1.25 g onto the skin daily.    lisinopril-hydrochlorothiazide (PRINZIDE,ZESTORETIC) 20-12.5 MG tablet Take 1 tablet by mouth daily.    Omega-3 Fatty Acids (OMEGA 3 PO) Take 1,800 mg by mouth daily.   [DISCONTINUED] lisdexamfetamine (VYVANSE) 70 MG capsule Take 70 mg by mouth daily.   [DISCONTINUED] OVER THE COUNTER MEDICATION Take 250 mg by mouth 2 (two) times daily. Rehlora otc supplement powder twice a day.   [DISCONTINUED] progesterone (PROMETRIUM) 100 MG capsule progesterone micronized 100 mg capsule   No facility-administered encounter medications on file as of 11/10/2019.    Allergy: No Known Allergies  Social Hx:   Social History   Socioeconomic History   Marital status: Married    Spouse name: Not on file   Number of children: Not on file   Years of education: Not on file   Highest education level: Not on file  Occupational History   Not on file  Tobacco Use   Smoking status: Never Smoker   Smokeless tobacco: Never Used  Vaping Use   Vaping Use: Never used  Substance and Sexual Activity   Alcohol use: Yes    Alcohol/week: 2.0 standard drinks    Types: 1 Glasses of wine, 1 Cans of beer per week    Comment: Daily drinks   Drug use: Never   Sexual activity: Not Currently  Other Topics Concern   Not on file  Social  History Narrative   Not on file   Social Determinants of Health   Financial Resource Strain:    Difficulty of Paying Living Expenses: Not on file  Food Insecurity:    Worried About [place] in the Last Year: Not on file   Ran Out of Food in the Last Year: Not on file  Transportation Needs:    Lack of Transportation (Medical): Not on file   Lack of Transportation (Non-Medical): Not on file  Physical Activity:    Days of Exercise per Week: Not on file   Minutes of Exercise per Session: Not on file  Stress:    Feeling of Stress : Not on file  Social Connections:    Frequency of Communication with Friends and Family: Not on file   Frequency of Social Gatherings with Friends and Family: Not on file   Attends Religious Services: Not on file  Active Member of Clubs or Organizations: Not on file   Attends Archivist Meetings: Not on file   Marital Status: Not on file  Intimate Partner Violence:    Fear of Current or Ex-Partner: Not on file   Emotionally Abused: Not on file   Physically Abused: Not on file   Sexually Abused: Not on file    Past Surgical Hx:  Past Surgical History:  Procedure Laterality Date   BREAST BIOPSY Right    negative   ROBOTIC ASSISTED TOTAL HYSTERECTOMY WITH BILATERAL SALPINGO OOPHERECTOMY Bilateral 05/08/2018   Procedure: XI ROBOTIC Fallis;  Surgeon: Everitt Amber, MD;  Location: [facility];  Service: Gynecology;  Laterality: Bilateral;   ROTATOR CUFF REPAIR Right    12-19    Past Medical Hx:  Past Medical History:  Diagnosis Date   Cancer ([place])    Complication of anesthesia    Family history of breast cancer    Hypertension    PONV (postoperative nausea and vomiting)     Past Gynecological History:  SVD x 2 Patient's last menstrual period was 04/23/2010.  Family Hx:  Family History  Problem Relation Age of Onset   Breast  cancer Mother 92   Alzheimer's disease Mother    Colon polyps Father     Review of Systems:  Constitutional  Feels well,    ENT Normal appearing ears and nares bilaterally Skin/Breast  No rash, sores, jaundice, itching, dryness Cardiovascular  No chest pain, shortness of breath, or edema  Pulmonary  No cough or wheeze.  Gastro Intestinal  No nausea, vomitting, or diarrhoea. No bright red blood per rectum, no abdominal pain, change in bowel movement, or constipation.  Genito Urinary  No frequency, urgency, dysuria, no bleeding, no bleeding Musculo Skeletal  No myalgia, arthralgia, joint swelling or pain  Neurologic  No weakness, numbness, change in gait,  Psychology  No depression, anxiety, insomnia.   Vitals:  Blood pressure 125/80, pulse 83, temperature 98 F (36.7 C), temperature source Tympanic, resp. rate 18, weight 128 lb 9.6 oz (58.3 kg), last menstrual period 04/23/2010, SpO2 100 %.  Physical Exam: WD in NAD Neck  Supple NROM, without any enlargements.  Lymph Node Survey No cervical supraclavicular or inguinal adenopathy Cardiovascular  Pulse normal rate, regularity and rhythm. S1 and S2 normal.  Lungs  Clear to auscultation bilateraly, without wheezes/crackles/rhonchi. Good air movement.  Skin  No rash/lesions/breakdown  Psychiatry  Alert and oriented to person, place, and time  Abdomen  Normoactive bowel sounds, abdomen soft, non-tender and thin. Incisions are soft. Underlying the left lateral incision site in the mid abdomen is a palpable 2-3cm lump. Reducible. Minimally tender. Soft.  Back No CVA tenderness Genito Urinary  Vaginal cuff smooth with no palpable masses beyond. Rectal  Good tone,  no cul de sac nodularity.  Extremities  No bilateral cyanosis, clubbing or edema.  Thereasa Solo, MD  11/10/2019, 2:34 PM

## 2019-11-16 ENCOUNTER — Other Ambulatory Visit: Payer: Self-pay | Admitting: Gynecologic Oncology

## 2019-11-16 ENCOUNTER — Telehealth: Payer: Self-pay | Admitting: *Deleted

## 2019-11-16 DIAGNOSIS — C562 Malignant neoplasm of left ovary: Secondary | ICD-10-CM

## 2019-11-16 NOTE — Telephone Encounter (Signed)
Called and scheduled the patient for a lab appt tomorrow for her CT scan.

## 2019-11-17 ENCOUNTER — Other Ambulatory Visit: Payer: Self-pay

## 2019-11-17 ENCOUNTER — Inpatient Hospital Stay: Payer: 59 | Attending: Gynecologic Oncology

## 2019-11-17 DIAGNOSIS — Z8543 Personal history of malignant neoplasm of ovary: Secondary | ICD-10-CM | POA: Insufficient documentation

## 2019-11-17 DIAGNOSIS — C562 Malignant neoplasm of left ovary: Secondary | ICD-10-CM

## 2019-11-17 LAB — COMPREHENSIVE METABOLIC PANEL
ALT: 23 U/L (ref 0–44)
AST: 31 U/L (ref 15–41)
Albumin: 3.9 g/dL (ref 3.5–5.0)
Alkaline Phosphatase: 44 U/L (ref 38–126)
Anion gap: 6 (ref 5–15)
BUN: 13 mg/dL (ref 6–20)
CO2: 30 mmol/L (ref 22–32)
Calcium: 9.8 mg/dL (ref 8.9–10.3)
Chloride: 99 mmol/L (ref 98–111)
Creatinine, Ser: 0.8 mg/dL (ref 0.44–1.00)
GFR calc non Af Amer: >60 mL/min (ref >60)
Glucose, Bld: 88 mg/dL (ref 70–99)
Potassium: 4.1 mmol/L (ref 3.5–5.1)
Sodium: 135 mmol/L (ref 135–145)
Total Bilirubin: 0.4 mg/dL (ref 0.3–1.2)
Total Protein: 7.2 g/dL (ref 6.5–8.1)

## 2019-11-19 ENCOUNTER — Ambulatory Visit (HOSPITAL_COMMUNITY)
Admission: RE | Admit: 2019-11-19 | Discharge: 2019-11-19 | Disposition: A | Discharge status: Home / Self Care | Payer: 59 | Source: Ambulatory Visit | Attending: Gynecologic Oncology | Admitting: Gynecologic Oncology

## 2019-11-19 ENCOUNTER — Other Ambulatory Visit: Payer: Self-pay

## 2019-11-19 DIAGNOSIS — C562 Malignant neoplasm of left ovary: Secondary | ICD-10-CM | POA: Diagnosis not present

## 2019-11-19 DIAGNOSIS — K439 Ventral hernia without obstruction or gangrene: Secondary | ICD-10-CM | POA: Diagnosis present

## 2019-11-19 MED ORDER — IOHEXOL 300 MG/ML  SOLN
100.0000 mL | Freq: Once | INTRAMUSCULAR | Status: AC | PRN
  Administered 2019-11-19: 100 mL via INTRAVENOUS

## 2019-11-20 ENCOUNTER — Telehealth: Payer: Self-pay

## 2019-11-20 NOTE — Telephone Encounter (Signed)
Told Ms Bomkamp that the CT showed the hernia which measures 3.9 cm. No evidence of bowel in the hernia. Proceed with plan to meet with Dr. Greer Pickerel on 12-11-19 per Zoila Shutter. A copy of the CT report faxed to Dr. Redmond Pulling. Pt verbalized understanding.

## 2019-12-15 ENCOUNTER — Telehealth: Payer: Self-pay | Admitting: *Deleted

## 2019-12-15 NOTE — Telephone Encounter (Signed)
Called [facility] to follow up on the patient's appt for 10/29. Patient canceled the appt and did not reschedule. Dr Denman George and Lenna Sciara APP notified

## 2019-12-30 ENCOUNTER — Other Ambulatory Visit: Payer: Self-pay | Admitting: Obstetrics and Gynecology

## 2019-12-30 DIAGNOSIS — Z1231 Encounter for screening mammogram for malignant neoplasm of breast: Secondary | ICD-10-CM

## 2020-02-10 ENCOUNTER — Ambulatory Visit: Payer: 59

## 2020-03-18 ENCOUNTER — Ambulatory Visit
Admission: RE | Admit: 2020-03-18 | Discharge: 2020-03-18 | Disposition: A | Discharge status: Home / Self Care | Payer: 59 | Source: Ambulatory Visit | Attending: Obstetrics and Gynecology | Admitting: Obstetrics and Gynecology

## 2020-03-18 ENCOUNTER — Other Ambulatory Visit: Payer: Self-pay

## 2020-03-18 DIAGNOSIS — Z1231 Encounter for screening mammogram for malignant neoplasm of breast: Secondary | ICD-10-CM

## 2020-04-18 ENCOUNTER — Inpatient Hospital Stay: Payer: 59 | Attending: Gynecologic Oncology

## 2020-04-18 ENCOUNTER — Other Ambulatory Visit: Payer: Self-pay

## 2020-04-18 DIAGNOSIS — Z9079 Acquired absence of other genital organ(s): Secondary | ICD-10-CM | POA: Diagnosis not present

## 2020-04-18 DIAGNOSIS — Z8543 Personal history of malignant neoplasm of ovary: Secondary | ICD-10-CM | POA: Diagnosis not present

## 2020-04-18 DIAGNOSIS — Z803 Family history of malignant neoplasm of breast: Secondary | ICD-10-CM | POA: Insufficient documentation

## 2020-04-18 DIAGNOSIS — I10 Essential (primary) hypertension: Secondary | ICD-10-CM | POA: Insufficient documentation

## 2020-04-18 DIAGNOSIS — Z79899 Other long term (current) drug therapy: Secondary | ICD-10-CM | POA: Diagnosis not present

## 2020-04-18 DIAGNOSIS — Z90722 Acquired absence of ovaries, bilateral: Secondary | ICD-10-CM | POA: Insufficient documentation

## 2020-04-18 DIAGNOSIS — C562 Malignant neoplasm of left ovary: Secondary | ICD-10-CM

## 2020-04-18 DIAGNOSIS — Z90710 Acquired absence of both cervix and uterus: Secondary | ICD-10-CM | POA: Diagnosis not present

## 2020-04-18 LAB — COMPREHENSIVE METABOLIC PANEL
ALT: 26 U/L (ref 0–44)
AST: 31 U/L (ref 15–41)
Albumin: 4.3 g/dL (ref 3.5–5.0)
Alkaline Phosphatase: 45 U/L (ref 38–126)
Anion gap: 10 (ref 5–15)
BUN: 14 mg/dL (ref 6–20)
CO2: 26 mmol/L (ref 22–32)
Calcium: 9.8 mg/dL (ref 8.9–10.3)
Chloride: 100 mmol/L (ref 98–111)
Creatinine, Ser: 1.05 mg/dL — ABNORMAL HIGH (ref 0.44–1.00)
GFR, Estimated: >60 mL/min (ref >60)
Glucose, Bld: 81 mg/dL (ref 70–99)
Potassium: 3.6 mmol/L (ref 3.5–5.1)
Sodium: 136 mmol/L (ref 135–145)
Total Bilirubin: 0.5 mg/dL (ref 0.3–1.2)
Total Protein: 8 g/dL (ref 6.5–8.1)

## 2020-04-18 LAB — CEA (IN HOUSE-CHCC): CEA (CHCC-In House): 2.91 ng/mL (ref 0.00–5.00)

## 2020-04-19 ENCOUNTER — Telehealth: Payer: Self-pay

## 2020-04-19 LAB — CA 125: Cancer Antigen (CA) 125: 8.8 U/mL (ref 0.0–38.1)

## 2020-04-19 NOTE — Telephone Encounter (Signed)
LM for Sara Werner that her CA-125 and CEA are within normal limits and stable. The creatine was slightly elevated at 1.05.  This can indicate dehydration.  Taking in 64 oz of water is important to stay hydrated. Follow up with Dr. Denman George on 04-25-20 as scheduled. She can call the office if she has any questions or concerns.

## 2020-04-22 NOTE — Progress Notes (Signed)
Follow-up Note: Gyn-Onc  Consult was initially requested by Dr. Nori Riis for the evaluation of Sara Werner 60 y.o. female  CC:  Chief Complaint  Patient presents with  . Left ovarian epithelial cancer [facility])    Assessment/Plan:  Sara Werner  is a 60 y.o.  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 BRCA negative, HR proficient. S/p resection on 05/08/2018. No adjuvant therapy recommended due to low risk factors for recurrence. Recommend continued 6 monthly follow-up with pelvic examination and tumor marker (CA 125 and CEA assessment).  Left port site hernia: does not desire surgical repair, asymptomatic, has had referral placed for [facility].  HPI: Ms Jennife Werner is a 60 year old woman who is seen in consultation at the request of Dr Nori Riis for a left ovarian mass and elevated CA 125.  The patient had an episode of straining to have a bowel movement approximately 1 week ago in March 2020.  She then felt some pelvic pressure.  She returned to [facility] and reported this to her OB/GYN GYN who performed a transvaginal ultrasound scan on May 01, 2018.  This revealed a uterus measuring 8 x 4 x 5 cm with an endometrium of 6 mm.  The right ovary was not visualized.  The left adnexa revealed a 10.6 x 6.5 x 9.8 cm complex mass seen.  It was partly cystic and partly solid.  There is some blood flow seen within the solid areas.  No normal ovarian tissue was seen.    A Ca1 25 was drawn on May 02, 2018 and this was mildly elevated at 56.1 (upper limit of normal 38.1).  CT abdo/pelvis on 05/05/18 was performed which showed a large pelvic mass with internal features concerning for malignancy but no apparent extrauterine disease.  On 05/08/18 she underwent robotic assisted total hysterectomy, BSO, omental biopsy and peritoneal staging biopsies for a clinical stage IC mucinous adenocarcinoma of the left ovary. Intraoperative  findings were significant for leakage of mucinous fluid from ovarian cyst (washings from this fluid/aspiration was negative for malignant cells). There was a 12cm left ovarian mass which was attached with filmy adhesions to the sigmoid colon and posterior uterus. The right ovary was grossly normal as was the uterus. There was no lymphadenopathy (lymphadenectomy not performed due to the mucinous cell type of cancer which is associated with <1% risk for lymph node metastases) and no peritoneal carcionomatosis/diaphragmatic lesions, or omental disease.   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 The primary tumor did not have surface involvement (other than peritoneal leakage was a stage IA).   She was determined to have low risk factors for recurrence (no surface involvement, negative washings/fluid, low grade lesion, mucinous cell type) and was recommended to have close follow-up without adjuvant therapy in accordance with NCCN guidelines.  Germline and somatic genetic testing was negative, including for BRCA.  Interval Hx:   CA125 was drawn on 04/27/19 and was normal at 12, and CEA on 04/27/19 was normal at 2.99.  CA 125 was drawn on 11/09/19 and was normal at 8.2 and CEA on 11/09/19 was normal at 3.03.  She reported a bothersome left lateral abdominal wall "lump" underlying a port site. It was reducible. On 11/19/19 she underwent a CT abd/pelvis to better evaluate this mass. It showed a small fat or omental containing ventral hernia  overlying the mid left hemiabdomen measuring 3.9 cm with a hernia neck measuring approximately 8 mm.  CA 125 on 04/18/20 was 8.8. CEA on 04/18/20 was 2.9.    Current Meds:  Outpatient Encounter Medications as of 04/25/2020  Medication Sig  . ADDERALL XR 30 MG 24 hr capsule Take 30 mg by mouth daily in the afternoon.  Marland Kitchen b complex vitamins tablet Take 1 tablet by mouth 2 (two)  times daily.  . cetirizine (ZYRTEC) 10 MG tablet Take 10 mg by mouth daily.  . chlorthalidone (HYGROTON) 50 MG tablet Take 25 mg by mouth daily.  . Cholecalciferol (VITAMIN D3) 125 MCG (5000 UT) CAPS Take 10,000 Units by mouth daily.  Marland Kitchen ESTROGEL 0.75 MG/1.25 GM (0.06%) topical gel Place 1.25 g onto the skin daily.   Marland Kitchen lisinopril-hydrochlorothiazide (PRINZIDE,ZESTORETIC) 20-12.5 MG tablet Take 1 tablet by mouth daily.   . Omega-3 Fatty Acids (OMEGA 3 PO) Take 1,800 mg by mouth daily.   No facility-administered encounter medications on file as of 04/25/2020.    Allergy: No Known Allergies  Social Hx:   Social History   Socioeconomic History  . Marital status: Married    Spouse name: Not on file  . Number of children: Not on file  . Years of education: Not on file  . Highest education level: Not on file  Occupational History  . Not on file  Tobacco Use  . Smoking status: Never Smoker  . Smokeless tobacco: Never Used  Vaping Use  . Vaping Use: Never used  Substance and Sexual Activity  . Alcohol use: Yes    Alcohol/week: 2.0 standard drinks    Types: 1 Glasses of wine, 1 Cans of beer per week    Comment: Daily drinks  . Drug use: Never  . Sexual activity: Not Currently  Other Topics Concern  . Not on file  Social History Narrative  . Not on file   Social Determinants of Health   Financial Resource Strain: Not on file  Food Insecurity: Not on file  Transportation Needs: Not on file  Physical Activity: Not on file  Stress: Not on file  Social Connections: Not on file  Intimate Partner Violence: Not on file    Past Surgical Hx:  Past Surgical History:  Procedure Laterality Date  . BREAST BIOPSY Right    negative  . ROBOTIC ASSISTED TOTAL HYSTERECTOMY WITH BILATERAL SALPINGO OOPHERECTOMY Bilateral 05/08/2018   Procedure: XI ROBOTIC ASSISTED TOTAL HYSTERECTOMY BILATERAL SALPINGO-OOPHORECTOMY,OMENTECTOMY WITH STAGING;  Surgeon: Everitt Amber, MD;  Location: [facility];   Service: Gynecology;  Laterality: Bilateral;  . ROTATOR CUFF REPAIR Right    12-19    Past Medical Hx:  Past Medical History:  Diagnosis Date  . Cancer ([place])   . Complication of anesthesia   . Family history of breast cancer   . Hypertension   . PONV (postoperative nausea and vomiting)     Past Gynecological History:  SVD x 2 Patient's last menstrual period was 04/23/2010.  Family Hx:  Family History  Problem Relation Age of Onset  . Breast cancer Mother 7  . Alzheimer's disease Mother   . Colon polyps Father     Review of Systems:  Constitutional  Feels well,    ENT Normal appearing ears and nares bilaterally Skin/Breast  No rash, sores, jaundice, itching, dryness Cardiovascular  No chest pain, shortness of breath, or edema  Pulmonary  No cough or wheeze.  Gastro Intestinal  No nausea, vomitting, or diarrhoea. No bright red blood  per rectum, no abdominal pain, change in bowel movement, or constipation.  Genito Urinary  No frequency, urgency, dysuria, no bleeding, no bleeding Musculo Skeletal  No myalgia, arthralgia, joint swelling or pain  Neurologic  No weakness, numbness, change in gait,  Psychology  No depression, anxiety, insomnia.   Vitals:  Blood pressure (!) 141/77, pulse 86, temperature 97.6 F (36.4 C), temperature source Tympanic, resp. rate 18, height '5\' 2"'  (1.575 m), weight 134 lb (60.8 kg), last menstrual period 04/23/2010, SpO2 100 %.  Physical Exam: WD in NAD Neck  Supple NROM, without any enlargements.  Lymph Node Survey No cervical supraclavicular or inguinal adenopathy Cardiovascular  Pulse normal rate, regularity and rhythm. S1 and S2 normal.  Lungs  Clear to auscultation bilateraly, without wheezes/crackles/rhonchi. Good air movement.  Skin  No rash/lesions/breakdown  Psychiatry  Alert and oriented to person, place, and time  Abdomen  Normoactive bowel sounds, abdomen soft, non-tender and thin. Incisions are soft. Underlying the  left lateral incision site in the mid abdomen is a palpable 2-3cm lump. Reducible. Minimally tender. Soft.  Back No CVA tenderness Genito Urinary  Vaginal cuff smooth with no palpable masses beyond. Rectal  Good tone,  no cul de sac nodularity.  Extremities  No bilateral cyanosis, clubbing or edema.  Thereasa Solo, MD  04/25/2020, 3:23 PM

## 2020-04-25 ENCOUNTER — Encounter: Payer: Self-pay | Admitting: Gynecologic Oncology

## 2020-04-25 ENCOUNTER — Other Ambulatory Visit: Payer: Self-pay

## 2020-04-25 ENCOUNTER — Inpatient Hospital Stay (HOSPITAL_BASED_OUTPATIENT_CLINIC_OR_DEPARTMENT_OTHER): Payer: 59 | Admitting: Gynecologic Oncology

## 2020-04-25 VITALS — BP 141/77 | HR 86 | Temp 97.6°F | Resp 18 | Ht 62.0 in | Wt 134.0 lb

## 2020-04-25 DIAGNOSIS — C562 Malignant neoplasm of left ovary: Secondary | ICD-10-CM

## 2020-04-25 DIAGNOSIS — Z8543 Personal history of malignant neoplasm of ovary: Secondary | ICD-10-CM

## 2020-04-25 NOTE — Patient Instructions (Signed)
Please notify Dr Denman George at phone number [phone] if you notice vaginal bleeding, new pelvic or abdominal pains, bloating, feeling full easy, or a change in bladder or bowel function.   Please contact Dr Serita Grit office (at [phone]) in or after June to request an appointment with her for September, 2022 and request a lab appointment for a day or two beforehand for your tumor markers to be drawn.

## 2020-07-18 ENCOUNTER — Telehealth: Payer: Self-pay

## 2020-07-18 NOTE — Telephone Encounter (Signed)
Spoke with Sara Werner this morning to schedule her lab appointment for 9/12 and her follow up visit with Dr. Denman George for 9/14. Patient agreed and verbalized understanding.

## 2020-10-24 ENCOUNTER — Inpatient Hospital Stay: Payer: Managed Care, Other (non HMO) | Attending: Gynecologic Oncology

## 2020-10-24 ENCOUNTER — Other Ambulatory Visit: Payer: Self-pay

## 2020-10-24 DIAGNOSIS — Z90710 Acquired absence of both cervix and uterus: Secondary | ICD-10-CM | POA: Diagnosis not present

## 2020-10-24 DIAGNOSIS — Z90722 Acquired absence of ovaries, bilateral: Secondary | ICD-10-CM | POA: Insufficient documentation

## 2020-10-24 DIAGNOSIS — C562 Malignant neoplasm of left ovary: Secondary | ICD-10-CM

## 2020-10-24 DIAGNOSIS — Z8543 Personal history of malignant neoplasm of ovary: Secondary | ICD-10-CM | POA: Insufficient documentation

## 2020-10-24 LAB — CEA (IN HOUSE-CHCC): CEA (CHCC-In House): 1.92 ng/mL (ref 0.00–5.00)

## 2020-10-25 LAB — CA 125: Cancer Antigen (CA) 125: 9.1 U/mL (ref 0.0–38.1)

## 2020-10-25 NOTE — Progress Notes (Signed)
Follow-up Note: Gyn-Onc  Consult was initially requested by Dr. Nori Riis for the evaluation of Sara Werner 60 y.o. female  CC:  Chief Complaint  Patient presents with   Left ovarian epithelial cancer [facility])     Assessment/Plan:  Sara Werner  is a 60 y.o.  year old with a history of left ovarian mucinous adenocarcinoma (low grade), stage IC1 (iatrogenic spill). BRCA negative, HR proficient. S/p resection on 05/08/2018. No adjuvant therapy recommended due to low risk factors for recurrence. Recommend continued 6 monthly follow-up with pelvic examination and tumor marker (CA 125 and CEA assessment).  She will follow-up with my partner, Dr Lahoma Crocker or Joylene John, NP in 6 months   as I am leaving the practice.   Left port site hernia: does not desire surgical repair, asymptomatic, has had referral placed for CCS. Not interested in surgery at this time.   HPI: Sara Werner is a 60 year old woman who is seen in consultation at the request of Dr Nori Riis for a left ovarian mass and elevated CA 125.  The patient had an episode of straining to have a bowel movement approximately 1 week ago in March 2020.  She then felt some pelvic pressure.  She returned to [facility] and reported this to her OB/GYN GYN who performed a transvaginal ultrasound scan on May 01, 2018.  This revealed a uterus measuring 8 x 4 x 5 cm with an endometrium of 6 mm.  The right ovary was not visualized.  The left adnexa revealed a 10.6 x 6.5 x 9.8 cm complex mass seen.  It was partly cystic and partly solid.  There is some blood flow seen within the solid areas.  No normal ovarian tissue was seen.    A Ca1 25 was drawn on May 02, 2018 and this was mildly elevated at 56.1 (upper limit of normal 38.1).  CT abdo/pelvis on 05/05/18 was performed which showed a large pelvic mass with internal features concerning for malignancy but no apparent extrauterine disease.  On 05/08/18 she underwent robotic assisted  total hysterectomy, BSO, omental biopsy and peritoneal staging biopsies for a clinical stage IC mucinous adenocarcinoma of the left ovary. Intraoperative findings were significant for leakage of mucinous fluid from ovarian cyst (washings from this fluid/aspiration was negative for malignant cells). There was a 12cm left ovarian mass which was attached with filmy adhesions to the sigmoid colon and posterior uterus. The right ovary was grossly normal as was the uterus. There was no lymphadenopathy (lymphadenectomy not performed due to the mucinous cell type of cancer which is associated with <1% risk for lymph node metastases) and no peritoneal carcionomatosis/diaphragmatic lesions, or omental disease.   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 The primary tumor did not have surface involvement (other than peritoneal leakage was a stage IA).   She was determined to have low risk factors for recurrence (no surface involvement, negative washings/fluid, low grade lesion, mucinous cell type) and was recommended to have close follow-up without adjuvant therapy in accordance with NCCN guidelines.  Germline and somatic genetic testing was negative, including for BRCA.  CA125 was drawn on 04/27/19 and was normal at 12, and CEA on 04/27/19 was normal at 2.99.  CA 125 was drawn on 11/09/19 and was normal at 8.2 and CEA on 11/09/19 was normal at 3.03.  She reported a bothersome left lateral abdominal wall "lump" underlying a  port site. It was reducible. On 11/19/19 she underwent a CT abd/pelvis to better evaluate this mass. It showed a small fat or omental containing ventral hernia overlying the mid left hemiabdomen measuring 3.9 cm with a hernia neck measuring approximately 8 mm.  CA 125 on 04/18/20 was 8.8. CEA on 04/18/20 was 2.9.    Interval Hx:  CA 125 on 10/24/20 was normal at 9.1. CEA on 10/24/20 was normal at  1.92.  She had no symptoms concerning for recurrence.   Current Meds:  Outpatient Encounter Medications as of 10/26/2020  Medication Sig   ADDERALL XR 30 MG 24 hr capsule Take 30 mg by mouth daily in the afternoon.   b complex vitamins tablet Take 1 tablet by mouth 2 (two) times daily.   cetirizine (ZYRTEC) 10 MG tablet Take 10 mg by mouth daily.   chlorthalidone (HYGROTON) 50 MG tablet Take 25 mg by mouth daily.   Cholecalciferol (VITAMIN D3) 125 MCG (5000 UT) CAPS Take 10,000 Units by mouth daily.   ESTROGEL 0.75 MG/1.25 GM (0.06%) topical gel Place 1.25 g onto the skin daily.    lisinopril-hydrochlorothiazide (PRINZIDE,ZESTORETIC) 20-12.5 MG tablet Take 1 tablet by mouth daily.    Omega-3 Fatty Acids (OMEGA 3 PO) Take 1,800 mg by mouth daily.   No facility-administered encounter medications on file as of 10/26/2020.    Allergy: No Known Allergies  Social Hx:   Social History   Socioeconomic History   Marital status: Married    Spouse name: Not on file   Number of children: Not on file   Years of education: Not on file   Highest education level: Not on file  Occupational History   Not on file  Tobacco Use   Smoking status: Never   Smokeless tobacco: Never  Vaping Use   Vaping Use: Never used  Substance and Sexual Activity   Alcohol use: Yes    Alcohol/week: 2.0 standard drinks    Types: 1 Glasses of wine, 1 Cans of beer per week    Comment: Daily drinks   Drug use: Never   Sexual activity: Not Currently  Other Topics Concern   Not on file  Social History Narrative   Not on file   Social Determinants of Health   Financial Resource Strain: Not on file  Food Insecurity: Not on file  Transportation Needs: Not on file  Physical Activity: Not on file  Stress: Not on file  Social Connections: Not on file  Intimate Partner Violence: Not on file    Past Surgical Hx:  Past Surgical History:  Procedure Laterality Date   BREAST BIOPSY Right    negative   ROBOTIC  ASSISTED TOTAL HYSTERECTOMY WITH BILATERAL SALPINGO OOPHERECTOMY Bilateral 05/08/2018   Procedure: XI ROBOTIC ASSISTED TOTAL HYSTERECTOMY BILATERAL SALPINGO-OOPHORECTOMY,OMENTECTOMY WITH STAGING;  Surgeon: Everitt Amber, MD;  Location: [facility];  Service: Gynecology;  Laterality: Bilateral;   ROTATOR CUFF REPAIR Right    12-19    Past Medical Hx:  Past Medical History:  Diagnosis Date   Cancer ([place])    Complication of anesthesia    Family history of breast cancer    Hypertension    PONV (postoperative nausea and vomiting)     Past Gynecological History:  SVD x 2 Patient's last menstrual period was 04/23/2010.  Family Hx:  Family History  Problem Relation Age of Onset   Breast cancer Mother 76   Alzheimer's disease Mother    Colon polyps Father     Review of Systems:  Constitutional  Feels well,    ENT Normal appearing ears and nares bilaterally Skin/Breast  No rash, sores, jaundice, itching, dryness Cardiovascular  No chest pain, shortness of breath, or edema  Pulmonary  No cough or wheeze.  Gastro Intestinal  No nausea, vomitting, or diarrhoea. No bright red blood per rectum, no abdominal pain, change in bowel movement, or constipation.  Genito Urinary  No frequency, urgency, dysuria, no bleeding, no bleeding Musculo Skeletal  No myalgia, arthralgia, joint swelling or pain  Neurologic  No weakness, numbness, change in gait,  Psychology  No depression, anxiety, insomnia.   Vitals:  Blood pressure 134/77, pulse 84, temperature 98.2 F (36.8 C), temperature source Oral, resp. rate 16, height _0  (1.575 m), weight 132 lb 6.4 oz (60.1 kg), last menstrual period 04/23/2010, SpO2 100 %.  Physical Exam: WD in NAD Neck  Supple NROM, without any enlargements.  Lymph Node Survey No cervical supraclavicular or inguinal adenopathy Cardiovascular  Pulse normal rate, regularity and rhythm. S1 and S2 normal.  Lungs  Clear to auscultation bilateraly, without  wheezes/crackles/rhonchi. Good air movement.  Skin  No rash/lesions/breakdown  Psychiatry  Alert and oriented to person, place, and time  Abdomen  Normoactive bowel sounds, abdomen soft, non-tender and thin. Incisions are soft. Underlying the left lateral incision site in the mid abdomen is a palpable 2-3cm lump. Reducible. Minimally tender. Soft.  Back No CVA tenderness Genito Urinary  Vaginal cuff smooth with no palpable masses beyond. Rectal  Good tone,  no cul de sac nodularity.  Extremities  No bilateral cyanosis, clubbing or edema.  Thereasa Solo, MD  10/26/2020, 1:36 PM

## 2020-10-26 ENCOUNTER — Other Ambulatory Visit: Payer: Self-pay

## 2020-10-26 ENCOUNTER — Inpatient Hospital Stay (HOSPITAL_BASED_OUTPATIENT_CLINIC_OR_DEPARTMENT_OTHER): Payer: Managed Care, Other (non HMO) | Admitting: Gynecologic Oncology

## 2020-10-26 ENCOUNTER — Encounter: Payer: Self-pay | Admitting: Gynecologic Oncology

## 2020-10-26 VITALS — BP 134/77 | HR 84 | Temp 98.2°F | Resp 16 | Ht 62.0 in | Wt 132.4 lb

## 2020-10-26 DIAGNOSIS — Z8543 Personal history of malignant neoplasm of ovary: Secondary | ICD-10-CM | POA: Diagnosis not present

## 2020-10-26 DIAGNOSIS — C562 Malignant neoplasm of left ovary: Secondary | ICD-10-CM

## 2020-10-26 NOTE — Patient Instructions (Signed)
Please notify Dr Denman George at phone number [phone] if you notice vaginal bleeding, new pelvic or abdominal pains, bloating, feeling full easy, or a change in bladder or bowel function.   Dr Denman George is departing the [place] at [facility] in October, 2022. Her partners and colleagues including Dr Berline Lopes, Dr Delsa Sale and Joylene John, Nurse Practitioner will be available to continue your care.   You are next scheduled to return to the Gynecologic Oncology office at the [facility] in March, 2023. Please call [phone] in January to request an appointment for March with Dr Serita Grit partner, Joylene John or Dr Lahoma Crocker.  Alternatively, please reach out to [facility] to schedule a new patient appointment (transfer of care). Dr Denman George will be seeing patients at their [facility] and [facility] as of October 1st, 2023.

## 2020-12-21 IMAGING — CT CT ABD-PELV W/ CM
2 of 5 series · 15 of 46 positions shown, 17 images · IV contrast (OMNIPAQUE)
Comparison: 05/05/2018

CLINICAL DATA: Ventral hernia, left mid abdominal hernia palpable
after prior laparoscopy

EXAM:
CT ABDOMEN AND PELVIS WITH CONTRAST
TECHNIQUE: Multidetector CT imaging of the abdomen and pelvis was performed
using the standard protocol following bolus administration of
intravenous contrast.
CONTRAST:  100mL OMNIPAQUE IOHEXOL 300 MG/ML SOLN, additional oral
enteric contrast

[Series 2: axial st · axial · 0.63mm/px · z∈[-386,-2]mm · 12 of 93 slices shown, 14 images]
[im 8/93  soft-tissue]
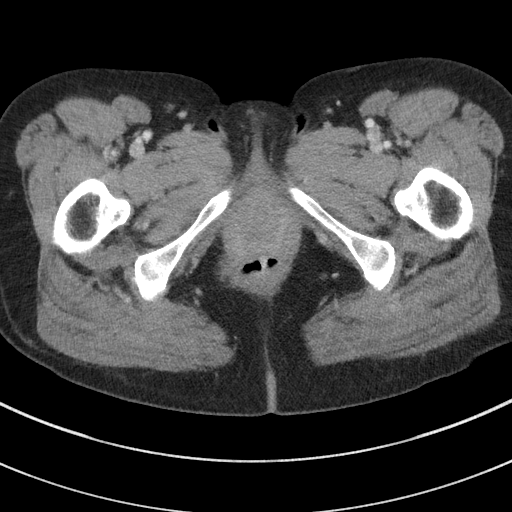
[im 8/93  bone]
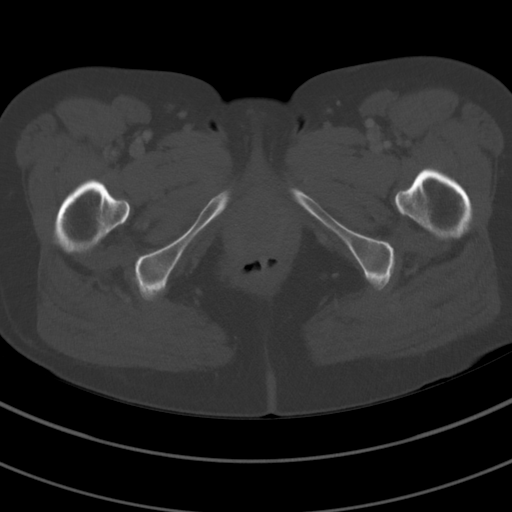
[im 15/93  soft-tissue]
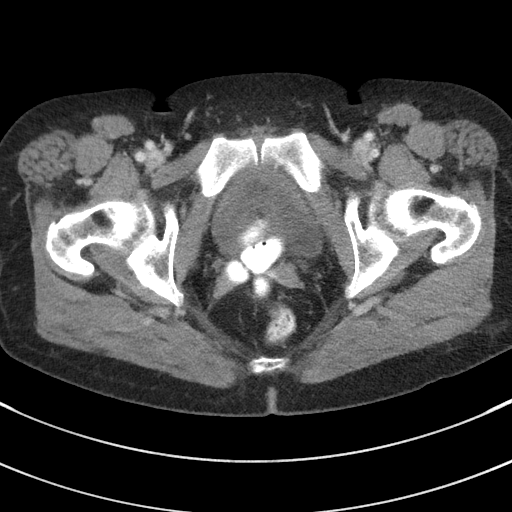
[im 22/93  soft-tissue]
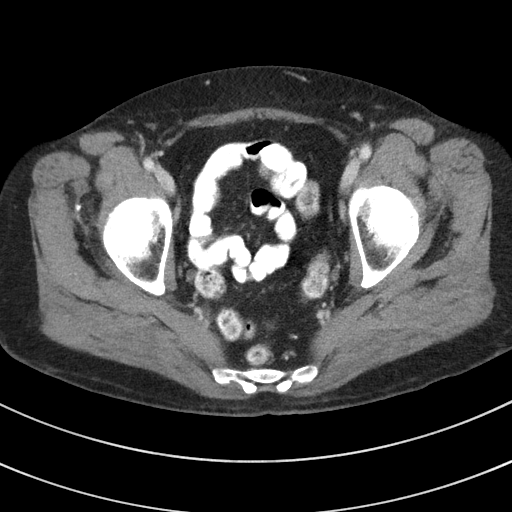
[im 29/93  soft-tissue]
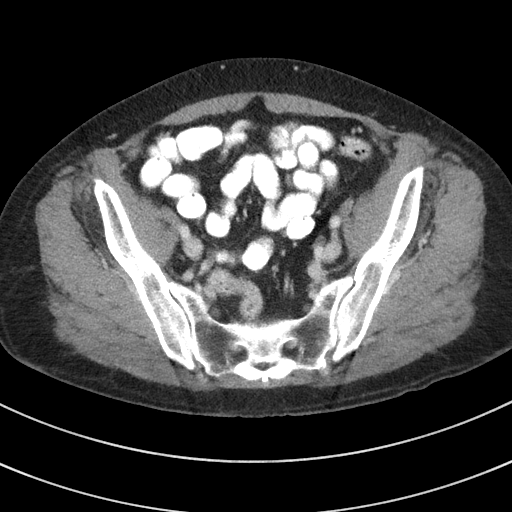
[im 36/93  soft-tissue]
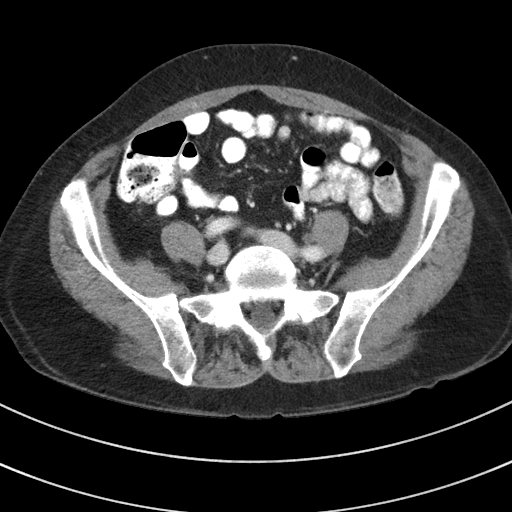
[im 43/93  soft-tissue]
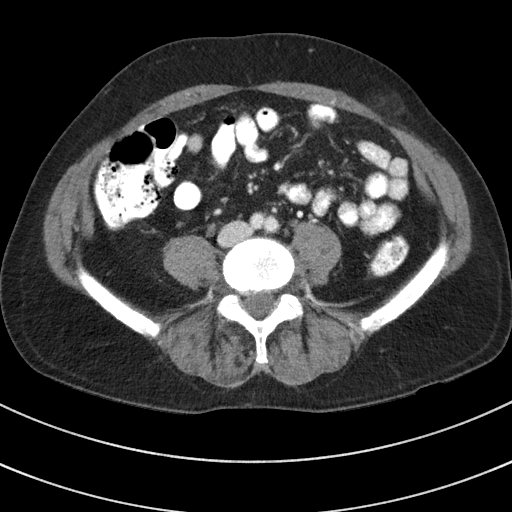
[im 50/93  soft-tissue]
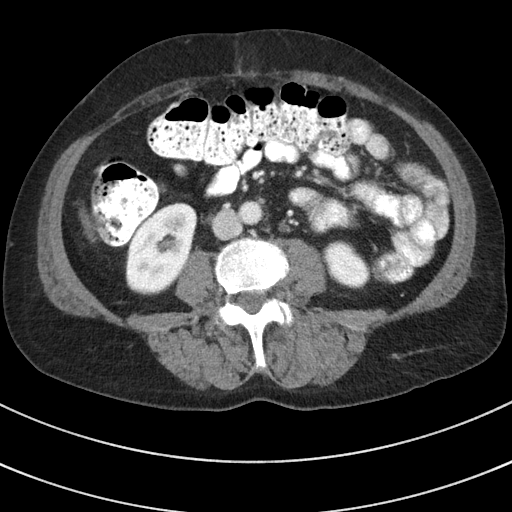
[im 57/93  soft-tissue]
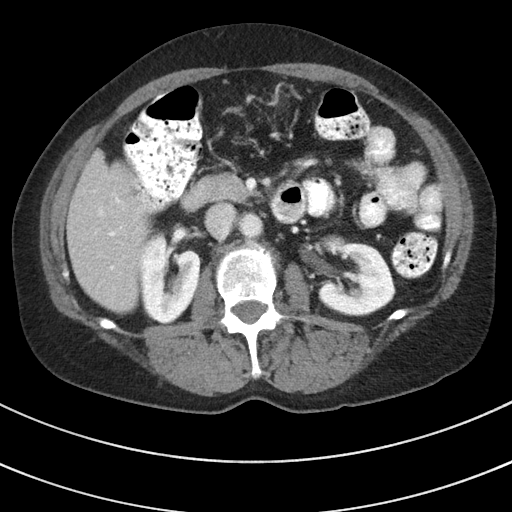
[im 64/93  soft-tissue]
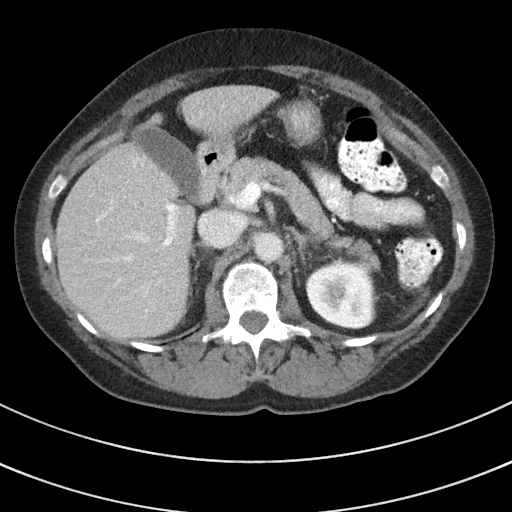
[im 64/93  bone]
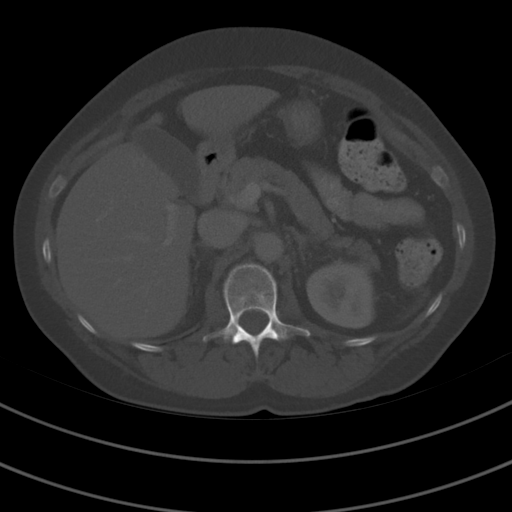
[im 71/93  soft-tissue]
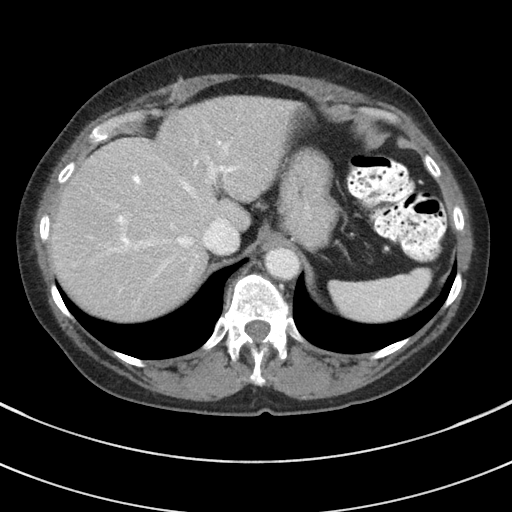
[im 78/93  soft-tissue]
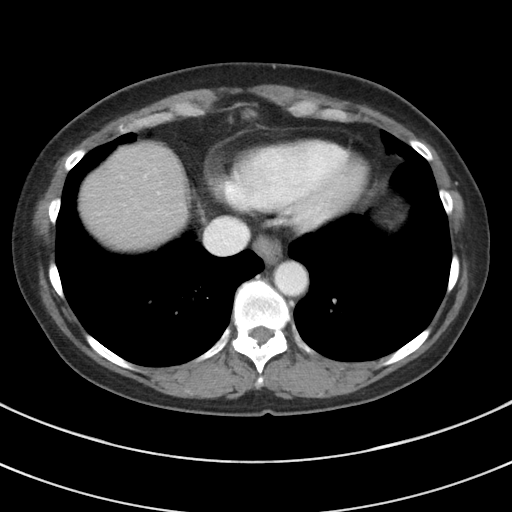
[im 85/93  soft-tissue]
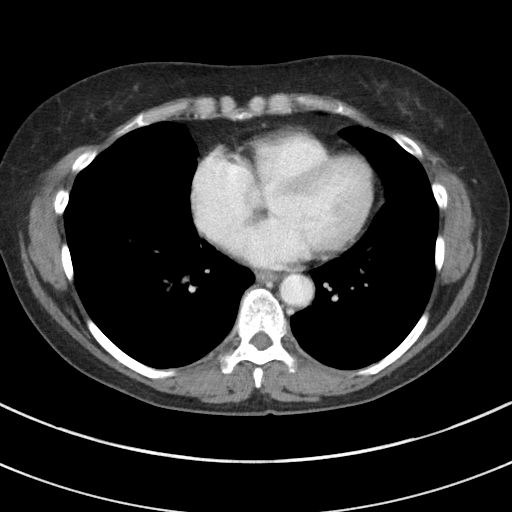

[Series 5: coronal st · coronal · 0.68mm/px · 3 of 101 slices shown]
[im 34/101  soft-tissue]
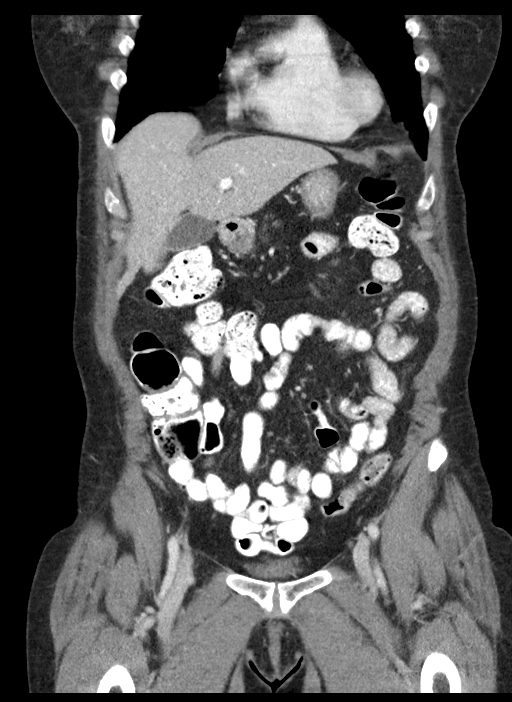
[im 45/101  soft-tissue]
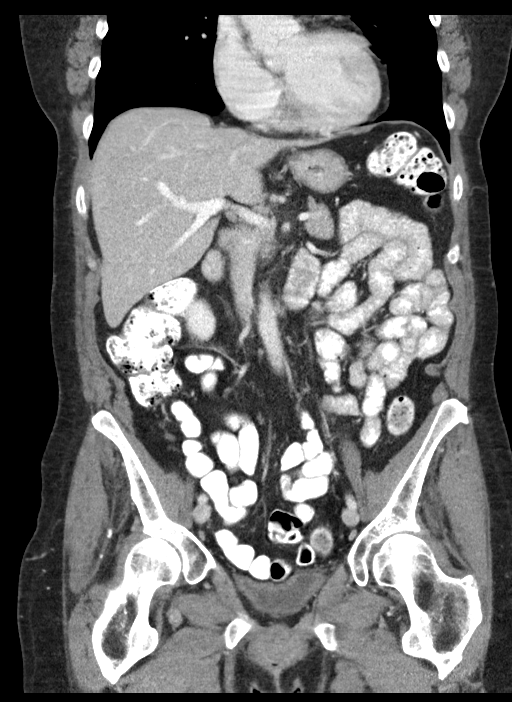
[im 56/101  soft-tissue]
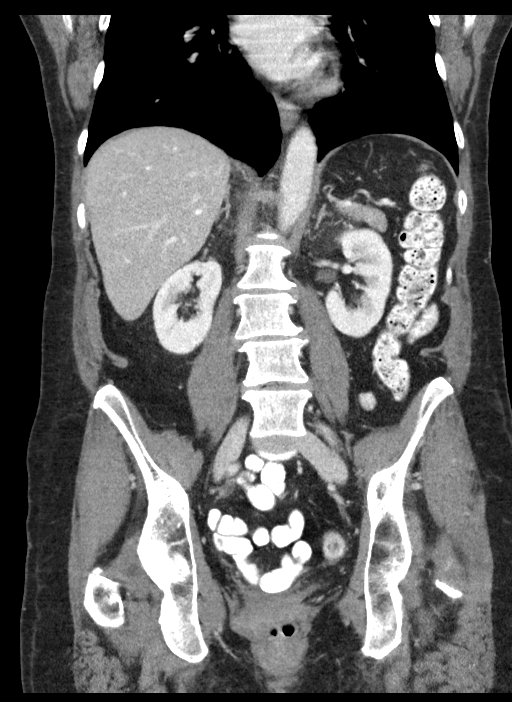

[15 of 46 positions shown; findings below may reference images not displayed]

FINDINGS: Lower chest: No acute abnormality.  Small hiatal hernia.

Hepatobiliary: No solid liver abnormality is seen. No gallstones,
gallbladder wall thickening, or biliary dilatation.

Pancreas: Unremarkable. No pancreatic ductal dilatation or
surrounding inflammatory changes.

Spleen: Normal in size without significant abnormality.

Adrenals/Urinary Tract: Adrenal glands are unremarkable. Kidneys are
normal, without renal calculi, solid lesion, or hydronephrosis.
Bladder is unremarkable.

Stomach/Bowel: Stomach is within normal limits. Appendix appears
normal. No evidence of bowel wall thickening, distention, or
inflammatory changes.

Vascular/Lymphatic: No significant vascular findings are present. No
enlarged abdominal or pelvic lymph nodes.

Reproductive: Status post hysterectomy.

Other: There is a small, fat or omentum containing ventral hernia
overlying the mid left hemiabdomen, measuring approximately 3.9 cm,
hernia neck measuring approximately 8 mm no abdominopelvic ascites.

Musculoskeletal: No acute or significant osseous findings.
IMPRESSION: 1. There is a small, fat or omentum containing ventral hernia
overlying the mid left hemiabdomen, measuring approximately 3.9 cm,
hernia neck measuring approximately 8 mm. No evidence of bowel
involvement. No other abdominal hernia.
2. Status post hysterectomy.

## 2021-02-15 ENCOUNTER — Other Ambulatory Visit: Payer: Self-pay | Admitting: Obstetrics and Gynecology

## 2021-02-16 ENCOUNTER — Other Ambulatory Visit: Payer: Self-pay | Admitting: Obstetrics and Gynecology

## 2021-02-16 DIAGNOSIS — Z1231 Encounter for screening mammogram for malignant neoplasm of breast: Secondary | ICD-10-CM

## 2021-02-21 DIAGNOSIS — Z1231 Encounter for screening mammogram for malignant neoplasm of breast: Secondary | ICD-10-CM

## 2021-03-01 ENCOUNTER — Other Ambulatory Visit (HOSPITAL_COMMUNITY): Payer: Self-pay

## 2021-03-01 MED ORDER — AMPHETAMINE-DEXTROAMPHET ER 30 MG PO CP24
ORAL_CAPSULE | ORAL | 0 refills | Status: DC
  Filled 2021-03-01 – 2021-03-03 (×3): qty 30, 30d supply, fill #0

## 2021-03-02 ENCOUNTER — Other Ambulatory Visit (HOSPITAL_COMMUNITY): Payer: Self-pay

## 2021-03-03 ENCOUNTER — Other Ambulatory Visit (HOSPITAL_COMMUNITY): Payer: Self-pay

## 2021-03-20 ENCOUNTER — Other Ambulatory Visit: Payer: Self-pay

## 2021-03-20 ENCOUNTER — Ambulatory Visit
Admission: RE | Admit: 2021-03-20 | Discharge: 2021-03-20 | Disposition: A | Discharge status: Home / Self Care | Payer: Managed Care, Other (non HMO) | Source: Ambulatory Visit

## 2021-03-20 DIAGNOSIS — Z1231 Encounter for screening mammogram for malignant neoplasm of breast: Secondary | ICD-10-CM

## 2021-03-23 ENCOUNTER — Other Ambulatory Visit (HOSPITAL_COMMUNITY): Payer: Self-pay

## 2021-03-23 MED ORDER — AMPHETAMINE-DEXTROAMPHET ER 30 MG PO CP24
ORAL_CAPSULE | ORAL | 0 refills | Status: DC
  Filled 2021-03-31: qty 30, 30d supply, fill #0

## 2021-03-27 ENCOUNTER — Other Ambulatory Visit (HOSPITAL_COMMUNITY): Payer: Self-pay

## 2021-03-30 ENCOUNTER — Other Ambulatory Visit (HOSPITAL_COMMUNITY): Payer: Self-pay

## 2021-03-31 ENCOUNTER — Other Ambulatory Visit (HOSPITAL_COMMUNITY): Payer: Self-pay

## 2021-04-26 ENCOUNTER — Other Ambulatory Visit (HOSPITAL_COMMUNITY): Payer: Self-pay

## 2021-04-26 MED ORDER — AMPHETAMINE-DEXTROAMPHET ER 30 MG PO CP24
ORAL_CAPSULE | ORAL | 0 refills | Status: DC
  Filled 2021-04-27: qty 30, 30d supply, fill #0

## 2021-04-27 ENCOUNTER — Other Ambulatory Visit (HOSPITAL_COMMUNITY): Payer: Self-pay

## 2021-04-28 ENCOUNTER — Other Ambulatory Visit (HOSPITAL_COMMUNITY): Payer: Self-pay

## 2021-05-26 ENCOUNTER — Other Ambulatory Visit (HOSPITAL_COMMUNITY): Payer: Self-pay

## 2021-05-26 MED ORDER — AMPHETAMINE-DEXTROAMPHET ER 30 MG PO CP24
ORAL_CAPSULE | ORAL | 0 refills | Status: DC
Start: 2021-05-26 — End: 2021-06-22
  Filled 2021-05-26: qty 30, 30d supply, fill #0

## 2021-05-29 ENCOUNTER — Other Ambulatory Visit (HOSPITAL_COMMUNITY): Payer: Self-pay

## 2021-06-22 ENCOUNTER — Other Ambulatory Visit (HOSPITAL_COMMUNITY): Payer: Self-pay

## 2021-06-22 MED ORDER — AMPHETAMINE-DEXTROAMPHET ER 30 MG PO CP24
ORAL_CAPSULE | ORAL | 0 refills | Status: DC
  Filled 2021-06-22 – 2021-06-26 (×2): qty 30, 30d supply, fill #0

## 2021-06-23 ENCOUNTER — Other Ambulatory Visit (HOSPITAL_COMMUNITY): Payer: Self-pay

## 2021-06-26 ENCOUNTER — Other Ambulatory Visit (HOSPITAL_COMMUNITY): Payer: Self-pay

## 2021-07-19 ENCOUNTER — Other Ambulatory Visit (HOSPITAL_COMMUNITY): Payer: Self-pay

## 2021-07-19 MED ORDER — AMPHETAMINE-DEXTROAMPHET ER 30 MG PO CP24
30.0000 mg | ORAL_CAPSULE | Freq: Every morning | ORAL | 0 refills | Status: DC
  Filled 2021-07-21: qty 30, 30d supply, fill #0

## 2021-07-21 ENCOUNTER — Other Ambulatory Visit (HOSPITAL_COMMUNITY): Payer: Self-pay

## 2021-07-22 ENCOUNTER — Emergency Department (HOSPITAL_COMMUNITY): Payer: Managed Care, Other (non HMO)

## 2021-07-22 ENCOUNTER — Inpatient Hospital Stay (HOSPITAL_COMMUNITY)
Admission: EM | Admit: 2021-07-22 | Discharge: 2021-07-25 | DRG: 473 | Disposition: A | Discharge status: Home / Self Care | Payer: Managed Care, Other (non HMO) | Attending: Neurosurgery | Admitting: Neurosurgery

## 2021-07-22 ENCOUNTER — Other Ambulatory Visit: Payer: Self-pay

## 2021-07-22 ENCOUNTER — Encounter (HOSPITAL_COMMUNITY): Payer: Self-pay

## 2021-07-22 DIAGNOSIS — M4722 Other spondylosis with radiculopathy, cervical region: Secondary | ICD-10-CM

## 2021-07-22 DIAGNOSIS — M4802 Spinal stenosis, cervical region: Secondary | ICD-10-CM | POA: Diagnosis present

## 2021-07-22 DIAGNOSIS — I10 Essential (primary) hypertension: Secondary | ICD-10-CM | POA: Diagnosis present

## 2021-07-22 DIAGNOSIS — M4712 Other spondylosis with myelopathy, cervical region: Secondary | ICD-10-CM | POA: Diagnosis present

## 2021-07-22 DIAGNOSIS — Z820 Family history of epilepsy and other diseases of the nervous system: Secondary | ICD-10-CM

## 2021-07-22 LAB — CBC WITH DIFFERENTIAL/PLATELET
Abs Immature Granulocytes: 0.06 10*3/uL (ref 0.00–0.07)
Basophils Absolute: 0 10*3/uL (ref 0.0–0.1)
Basophils Relative: 0 %
Eosinophils Absolute: 0 10*3/uL (ref 0.0–0.5)
Eosinophils Relative: 0 %
HCT: 36.9 % (ref 36.0–46.0)
Hemoglobin: 13.3 g/dL (ref 12.0–15.0)
Immature Granulocytes: 1 %
Lymphocytes Relative: 9 %
Lymphs Abs: 1 10*3/uL (ref 0.7–4.0)
MCH: 33.2 pg (ref 26.0–34.0)
MCHC: 36 g/dL (ref 30.0–36.0)
MCV: 92 fL (ref 80.0–100.0)
Monocytes Absolute: 0.4 10*3/uL (ref 0.1–1.0)
Monocytes Relative: 3 %
Neutro Abs: 10.4 10*3/uL — ABNORMAL HIGH (ref 1.7–7.7)
Neutrophils Relative %: 87 %
Platelets: 505 10*3/uL — ABNORMAL HIGH (ref 150–400)
RBC: 4.01 MIL/uL (ref 3.87–5.11)
RDW: 12.4 % (ref 11.5–15.5)
WBC: 11.9 10*3/uL — ABNORMAL HIGH (ref 4.0–10.5)
nRBC: 0 % (ref 0.0–0.2)

## 2021-07-22 LAB — BASIC METABOLIC PANEL
Anion gap: 13 (ref 5–15)
BUN: 10 mg/dL (ref 8–23)
CO2: 25 mmol/L (ref 22–32)
Calcium: 9.2 mg/dL (ref 8.9–10.3)
Chloride: 87 mmol/L — ABNORMAL LOW (ref 98–111)
Creatinine, Ser: 0.65 mg/dL (ref 0.44–1.00)
GFR, Estimated: >60 mL/min (ref >60)
Glucose, Bld: 206 mg/dL — ABNORMAL HIGH (ref 70–99)
Potassium: 3.6 mmol/L (ref 3.5–5.1)
Sodium: 125 mmol/L — ABNORMAL LOW (ref 135–145)

## 2021-07-22 LAB — SEDIMENTATION RATE: Sed Rate: 56 mm/hr — ABNORMAL HIGH (ref 0–22)

## 2021-07-22 MED ORDER — LISINOPRIL 20 MG PO TABS
20.0000 mg | ORAL_TABLET | Freq: Every day | ORAL | Status: DC
  Administered 2021-07-22 – 2021-07-23 (×2): 20 mg via ORAL
  Filled 2021-07-22 (×2): qty 1

## 2021-07-22 MED ORDER — OMEGA 3 1000 MG PO CAPS: ORAL_CAPSULE | Freq: Every day | ORAL | Status: DC

## 2021-07-22 MED ORDER — HYDROCHLOROTHIAZIDE 12.5 MG PO TABS
12.5000 mg | ORAL_TABLET | Freq: Every day | ORAL | Status: DC
  Administered 2021-07-22: 12.5 mg via ORAL
  Filled 2021-07-22 (×2): qty 1

## 2021-07-22 MED ORDER — HYDROMORPHONE HCL 1 MG/ML IJ SOLN
0.5000 mg | INTRAMUSCULAR | Status: DC | PRN
  Administered 2021-07-22 – 2021-07-24 (×6): 1 mg via INTRAVENOUS
  Filled 2021-07-22 (×6): qty 1

## 2021-07-22 MED ORDER — FENTANYL CITRATE PF 50 MCG/ML IJ SOSY
100.0000 ug | PREFILLED_SYRINGE | INTRAMUSCULAR | Status: DC | PRN
  Administered 2021-07-22 (×2): 100 ug via INTRAVENOUS
  Filled 2021-07-22 (×2): qty 2

## 2021-07-22 MED ORDER — CHLORTHALIDONE 25 MG PO TABS
25.0000 mg | ORAL_TABLET | Freq: Every day | ORAL | Status: DC
  Administered 2021-07-22 – 2021-07-25 (×2): 25 mg via ORAL
  Filled 2021-07-22 (×4): qty 1

## 2021-07-22 MED ORDER — SODIUM CHLORIDE 0.9 % IV SOLN: INTRAVENOUS | Status: DC

## 2021-07-22 MED ORDER — SODIUM CHLORIDE 0.9% FLUSH
3.0000 mL | Freq: Two times a day (BID) | INTRAVENOUS | Status: DC
  Administered 2021-07-22 – 2021-07-24 (×4): 3 mL via INTRAVENOUS

## 2021-07-22 MED ORDER — B COMPLEX PO TABS: 1.0000 | ORAL_TABLET | Freq: Two times a day (BID) | ORAL | Status: DC

## 2021-07-22 MED ORDER — AMPHETAMINE-DEXTROAMPHET ER 30 MG PO CP24: 30.0000 mg | ORAL_CAPSULE | Freq: Every day | ORAL | Status: DC

## 2021-07-22 MED ORDER — ONDANSETRON HCL 4 MG/2ML IJ SOLN: 4.0000 mg | Freq: Four times a day (QID) | INTRAMUSCULAR | Status: DC | PRN

## 2021-07-22 MED ORDER — VITAMIN D 25 MCG (1000 UNIT) PO TABS
10000.0000 [IU] | ORAL_TABLET | Freq: Every day | ORAL | Status: DC
  Administered 2021-07-22 – 2021-07-23 (×2): 10000 [IU] via ORAL
  Filled 2021-07-22 (×3): qty 10

## 2021-07-22 MED ORDER — ONDANSETRON HCL 4 MG PO TABS: 4.0000 mg | ORAL_TABLET | Freq: Four times a day (QID) | ORAL | Status: DC | PRN

## 2021-07-22 MED ORDER — DOCUSATE SODIUM 100 MG PO CAPS
100.0000 mg | ORAL_CAPSULE | Freq: Two times a day (BID) | ORAL | Status: DC
  Administered 2021-07-22 – 2021-07-23 (×3): 100 mg via ORAL
  Filled 2021-07-22 (×3): qty 1

## 2021-07-22 MED ORDER — ACETAMINOPHEN 650 MG RE SUPP: 650.0000 mg | Freq: Four times a day (QID) | RECTAL | Status: DC | PRN

## 2021-07-22 MED ORDER — ACETAMINOPHEN 325 MG PO TABS: 650.0000 mg | ORAL_TABLET | Freq: Four times a day (QID) | ORAL | Status: DC | PRN

## 2021-07-22 MED ORDER — LISINOPRIL-HYDROCHLOROTHIAZIDE 20-12.5 MG PO TABS: 1.0000 | ORAL_TABLET | Freq: Every day | ORAL | Status: DC

## 2021-07-22 MED ORDER — SODIUM CHLORIDE 0.9 % IV SOLN
INTRAVENOUS | Status: DC
Start: 2021-07-22 — End: 2021-07-24

## 2021-07-22 MED ORDER — LORAZEPAM 2 MG/ML IJ SOLN: 1.0000 mg | INTRAMUSCULAR | Status: DC | PRN

## 2021-07-22 MED ORDER — OXYCODONE HCL 5 MG PO TABS
5.0000 mg | ORAL_TABLET | ORAL | Status: DC | PRN
  Administered 2021-07-23: 5 mg via ORAL
  Filled 2021-07-22 (×2): qty 1

## 2021-07-22 MED ORDER — DEXAMETHASONE SODIUM PHOSPHATE 10 MG/ML IJ SOLN
10.0000 mg | Freq: Four times a day (QID) | INTRAMUSCULAR | Status: DC
  Administered 2021-07-22 – 2021-07-24 (×7): 10 mg via INTRAVENOUS
  Filled 2021-07-22 (×7): qty 1

## 2021-07-22 MED ORDER — METHOCARBAMOL 1000 MG/10ML IJ SOLN
500.0000 mg | Freq: Four times a day (QID) | INTRAMUSCULAR | Status: DC | PRN
  Filled 2021-07-22: qty 5

## 2021-07-22 NOTE — ED Provider Triage Note (Signed)
Emergency Medicine Provider Triage Evaluation Note  Sara Werner , a 61 y.o. female  was evaluated in triage.  Pt complains of neck pain which worsen a couple of days ago.  She is currently taking muscle relaxers along with steroid pack at home, finishing up the pack.  She endorses severe pain along the right side of the neck with bilateral arm tingling, endorses the pain is exacerbated with any type of movement.  Does report feeling more stiff when she is lying down.  See [facility] recently who provided the medication for her but has an appointment scheduled with Dr. Dellis Werner being upcoming.  Imaging that has been done for her but has only been an x-ray.  No prior history of problems with her neck..  Review of Systems  Positive: Neck pain, arm tingling BL Negative: Fever, chest pain  Physical Exam  BP 133/90   Pulse 82   Temp 98.6 F (37 C) (Oral)   Resp 14   LMP 04/23/2010   SpO2 100%  Gen:   Awake, no distress   Resp:  Normal effort  MSK:   Moves extremities without difficulty  Other:  Soft collar in place, limited ROM due to pain, reports pain along the left side of the neck but worst on right side of the neck  Medical Decision Making  Medically screening exam initiated at 2:39 PM.  Appropriate orders placed.  Sara Werner was informed that the remainder of the evaluation will be completed by another provider, this initial triage assessment does not replace that evaluation, and the importance of remaining in the ED until their evaluation is complete.     Sara Fitting, PA-C 07/22/21 1441

## 2021-07-22 NOTE — H&P (Signed)
Sara Werner is an 61 y.o. female.   Chief Complaint: Neck and right arm pain HPI: 61 year old female with severe neck pain with radiation to both upper extremities right greater than left.  Symptoms ongoing for more than 1 month.  They have been very severe for the past 2 weeks.  The patient reports near constant posterior cervical pain with extension into her scapular region bilaterally.  She has shooting pain into her right upper extremity predominantly radiating into the radial and dorsal aspect of her right hand.  She has ongoing symptoms of numbness and has lost coordination and motor control in both hands.  She is not aware of any definite lower extremity dysfunction.  She is having no bowel or bladder dysfunction.  She has no history of accident or injury.  She has been treated with anti-inflammatory occasions.  She has been treated with oral steroids.  She has undergone treatment with narcotic analgesics and muscle relaxants all without improvement.  The patient's symptoms became so severe today that she required EMS transport to the emergency department.  Past Medical History:  Diagnosis Date   Cancer ([place])    Complication of anesthesia    Family history of breast cancer    Hypertension    PONV (postoperative nausea and vomiting)     Past Surgical History:  Procedure Laterality Date   BREAST BIOPSY Right    negative   ROBOTIC ASSISTED TOTAL HYSTERECTOMY WITH BILATERAL SALPINGO OOPHERECTOMY Bilateral 05/08/2018   Procedure: XI ROBOTIC ASSISTED TOTAL HYSTERECTOMY BILATERAL SALPINGO-OOPHORECTOMY,OMENTECTOMY WITH STAGING;  Surgeon: Everitt Amber, MD;  Location: [facility];  Service: Gynecology;  Laterality: Bilateral;   ROTATOR CUFF REPAIR Right    12-19    Family History  Problem Relation Age of Onset   Breast cancer Mother 75   Alzheimer's disease Mother    Colon polyps Father    Social History:  reports that she has never smoked. She has never used smokeless tobacco. She reports  current alcohol use of about 2.0 standard drinks of alcohol per week. She reports that she does not use drugs.  Allergies: No Known Allergies  (Not in a hospital admission)   Results for orders placed or performed during the hospital encounter of 07/22/21 (from the past 48 hour(s))  Basic metabolic panel     Status: Abnormal   Collection Time: 07/22/21  5:05 PM  Result Value Ref Range   Sodium 125 (L) 135 - 145 mmol/L   Potassium 3.6 3.5 - 5.1 mmol/L   Chloride 87 (L) 98 - 111 mmol/L   CO2 25 22 - 32 mmol/L   Glucose, Bld 206 (H) 70 - 99 mg/dL    Comment: Glucose reference range applies only to samples taken after fasting for at least 8 hours.   BUN 10 8 - 23 mg/dL   Creatinine, Ser 0.65 0.44 - 1.00 mg/dL   Calcium 9.2 8.9 - 10.3 mg/dL   GFR, Estimated >60 >60 mL/min    Comment: (NOTE) Calculated using the CKD-EPI Creatinine Equation (2021)    Anion gap 13 5 - 15    Comment: Performed at [facility] [address], [place], [place] [address]  CBC with Differential     Status: Abnormal   Collection Time: 07/22/21  5:05 PM  Result Value Ref Range   WBC 11.9 (H) 4.0 - 10.5 K/uL   RBC 4.01 3.87 - 5.11 MIL/uL   Hemoglobin 13.3 12.0 - 15.0 g/dL   HCT 36.9 36.0 - 46.0 %  MCV 92.0 80.0 - 100.0 fL   MCH 33.2 26.0 - 34.0 pg   MCHC 36.0 30.0 - 36.0 g/dL   RDW 12.4 11.5 - 15.5 %   Platelets 505 (H) 150 - 400 K/uL   nRBC 0.0 0.0 - 0.2 %   Neutrophils Relative % 87 %   Neutro Abs 10.4 (H) 1.7 - 7.7 K/uL   Lymphocytes Relative 9 %   Lymphs Abs 1.0 0.7 - 4.0 K/uL   Monocytes Relative 3 %   Monocytes Absolute 0.4 0.1 - 1.0 K/uL   Eosinophils Relative 0 %   Eosinophils Absolute 0.0 0.0 - 0.5 K/uL   Basophils Relative 0 %   Basophils Absolute 0.0 0.0 - 0.1 K/uL   Immature Granulocytes 1 %   Abs Immature Granulocytes 0.06 0.00 - 0.07 K/uL    Comment: Performed at [facility] [address], [place], [place] [address]  Sedimentation rate     Status: Abnormal    Collection Time: 07/22/21  5:05 PM  Result Value Ref Range   Sed Rate 56 (H) 0 - 22 mm/hr    Comment: Performed at [facility] [address], [place], [place] [address]   MR Cervical Spine Wo Contrast  Result Date: 07/22/2021 CLINICAL DATA:  Neck pain, chronic, degenerative changes on xray. Severe right-sided neck pain with bilateral arm tingling, exacerbated by movement. EXAM: MRI CERVICAL SPINE WITHOUT CONTRAST TECHNIQUE: Multiplanar, multisequence MR imaging of the cervical spine was performed. No intravenous contrast was administered. COMPARISON:  None Available. FINDINGS: The study is mildly motion degraded throughout. Alignment: Reversal of the normal cervical lordosis. Minimal anterolisthesis of C3 on C4 and C4 on C5. Vertebrae: No fracture or suspicious marrow lesion. Facet edema on the left at C3-4 and on the right at C4-5, likely degenerative. Widespread degenerative endplate changes in the cervical and upper thoracic spine including mild multilevel degenerative endplate edema. Cord: Normal signal. Posterior Fossa, vertebral arteries, paraspinal tissues: Unremarkable. Disc levels: Moderate disc space narrowing at C2-3, C4-5, and T1-2 and severe narrowing at C5-6 and C6-7. C2-3: Disc bulging, uncovertebral spurring, and mild facet arthrosis without significant stenosis. C3-4: Anterolisthesis with bulging uncovered disc, uncovertebral spurring, and mild right and severe left facet arthrosis result in severe left neural foraminal stenosis without significant spinal stenosis. C4-5: Anterolisthesis with bulging uncovered disc, uncovertebral spurring, and severe facet arthrosis result in mild spinal stenosis and severe right and mild left neural foraminal stenosis. C5-6: Broad-based posterior disc osteophyte complex, left paracentral disc protrusion, and mild facet arthrosis result in mild to moderate left-sided spinal stenosis with mild cord flattening and moderate right and mild left neural  foraminal stenosis. C6-7: A broad-based posterior disc osteophyte complex and left central disc extrusion with caudal migration result in moderate spinal stenosis with mild cord flattening and moderate right and mild left neural foraminal stenosis. C7-T1: Disc bulging, uncovertebral spurring, and moderate facet arthrosis result in mild spinal stenosis and mild right neural foraminal stenosis. T1-2: Only imaged sagittally. Disc bulging and facet arthrosis result in mild spinal stenosis and moderate to severe left greater than right neural foraminal stenosis. IMPRESSION: 1. Advanced cervical disc and facet degeneration with moderate spinal stenosis at C6-7 and mild-to-moderate spinal stenosis at C5-6. 2. Moderate to severe multilevel neural foraminal stenosis as above. Electronically Signed   By: Logan Bores M.D.   On: 07/22/2021 18:35    Pertinent items noted in HPI and remainder of comprehensive ROS otherwise negative.  Blood pressure 136/85, pulse  64, temperature 98.6 F (37 C), temperature source Oral, resp. rate 15, last menstrual period 04/23/2010, SpO2 94 %.  Patient is awake and alert.  She is oriented and appropriate.  Her speech is fluent.  Her judgment and insight appear intact.  Cranial nerve function normal bilateral.  Motor examination reveals some mild weakness of wrist extension and triceps function on the right side otherwise motor strength intact.  Sensory examination with some decrease sensation to pinprick and light touch distally in both upper extremities more in her right C6 and C7 dermatomes but also with a little bit of bilateral C8 dermatomal involvement.  Reflexes are normal in both biceps.  There is brisk in her left triceps.  There absent in her right triceps.  Her lower extremity reflexes are increased.  She has no Hoffmann's.  Toes are equivocal to plantar stimulation.  Examination head ears eyes nose and throat is normal.  Chest and abdomen benign.  Extremities are free from  injury or deformity. Assessment/Plan The patient has very severe multilevel cervical disc degeneration with associated spondylosis and stenosis.  At C4-5 she has evidence of degenerative anterolisthesis with severe bilateral neural foraminal stenosis right greater than left.  At C5-6 she has evidence of severe spondylosis and stenosis with marked left-sided spinal cord compression and severe bilateral neural foraminal stenosis right greater than left.  At C6-7 she has a similar degree of spondylitic disease with once again severe left-sided spinal cord compression and severe bilateral neural foraminal stenosis.  Remainder of her cervical spine appears reasonably healthy.  The patient has severe ongoing cervical radiculopathy.  She also has significant cord compression and evidence of cervical myelopathy with both her symptoms and exam.  I discussed options well for further management.  Given her extreme pain I do not think the patient is likely to thrive at home and I will admit her for pain control and urgent surgery early this week.  I have discussed the risks and benefits involved with the C4-5, C5-6 and C6-7 anterior cervical discectomy and interbody fusion utilizing interbody cages, local harvested autograft, and anterior plate instrumentation.  I discussed the risks involved with surgery including but not limited to the risk of anesthesia, bleeding, infection, CSF leak, nerve root injury, spinal cord injury, fusion failure, instrumentation failure, dysphagia, dysphonia, continued pain, not benefit.  Patient and her husband been given the option as questions.  They appear to understand.  They wish to proceed.  Mallie Mussel A Ayla Dunigan 07/22/2021, 8:16 PM

## 2021-07-22 NOTE — Progress Notes (Signed)
Patient arrived in the unit at 2200 from [place], A&O x4, resting in a bed, and will continue to monitor closely.

## 2021-07-22 NOTE — ED Triage Notes (Signed)
Patient complaining of posterior neck pain with tingling down right arm and inability to ambulate due to the pain intermittently for several days. Patient has had ray and been on muscle relaxer and steroid with minimal to no relief. Arrived in c-collar. Had PA screening on arrival

## 2021-07-22 NOTE — ED Notes (Signed)
Dr. Wentz at bedside. 

## 2021-07-22 NOTE — ED Notes (Signed)
Patient transported to MRI 

## 2021-07-22 NOTE — ED Provider Notes (Signed)
[facility] EMERGENCY DEPARTMENT Provider Note   CSN: [id number] Arrival date & time: 07/22/21  1429     History  Chief Complaint  Patient presents with   Neck Injury    Sara Werner is a 61 y.o. female.  HPI She presents for evaluation of severe pain and numbness in her right arm, for 2 weeks, worsening despite treatment with steroids and narcotic analgesia.  No significant trauma is known.  Evaluated 2 weeks ago with plain images and found to have arthritis.  She was placed on steroids and given a soft cervical collar.  She was referred to orthopedic spine surgeon, but has not seen him yet.  She is able to walk.  She came here by EMS because of the severe pain.    Home Medications Prior to Admission medications   Medication Sig Start Date End Date Taking? Authorizing Provider  acetaminophen (TYLENOL) 500 MG tablet Take 500 mg by mouth every 6 (six) hours as needed for moderate pain or headache.   Yes [provider]  amphetamine-dextroamphetamine (ADDERALL XR) 30 MG 24 hr capsule Take 1 capsule  by mouth every morning. 07/19/21  Yes   b complex vitamins tablet Take 1 tablet by mouth daily.   Yes [provider]  Carboxymethylcellulose Sodium (REFRESH TEARS OP) Place 1 drop into both eyes daily as needed (dry eyes).   Yes [provider]  cetirizine (ZYRTEC) 10 MG tablet Take 10 mg by mouth daily.   Yes [provider]  chlorthalidone (HYGROTON) 50 MG tablet Take 50 mg by mouth daily.   Yes [provider]  Cholecalciferol (VITAMIN D3) 125 MCG (5000 UT) CAPS Take 10,000 Units by mouth daily.   Yes [provider]  ESTROGEL 0.75 MG/1.25 GM (0.06%) topical gel Place 1.25 g onto the skin daily.  04/15/18  Yes [provider]  ibuprofen (ADVIL) 200 MG tablet Take 400-600 mg by mouth every 6 (six) hours as needed for headache or moderate pain.   Yes [provider]  lisinopril-hydrochlorothiazide  (PRINZIDE,ZESTORETIC) 20-12.5 MG tablet Take 1 tablet by mouth daily.  03/24/18  Yes [provider]  methocarbamol (ROBAXIN) 500 MG tablet Take 500 mg by mouth 2 (two) times daily as needed for muscle spasms. 07/18/21  Yes [provider]  Omega-3 Fatty Acids (OMEGA 3 PO) Take 1,285 mg by mouth daily.   Yes [provider]  predniSONE (DELTASONE) 10 MG tablet Take 10 mg by mouth See admin instructions. 3 tabs x 2 days, 2 tabs x 5 days, 1 tab qd until gone 07/18/21  Yes [provider]  ADDERALL XR 30 MG 24 hr capsule Take 30 mg by mouth daily in the afternoon. Patient not taking: Reported on 07/22/2021 10/15/19   [provider]      Allergies    Patient has no known allergies.    Review of Systems   Review of Systems  Physical Exam Updated Vital Signs BP 129/76 (BP Location: Left Arm)   Pulse 68   Temp 97.7 F (36.5 C) (Oral)   Resp 20   LMP 04/23/2010   SpO2 99%  Physical Exam Vitals and nursing note reviewed.  Constitutional:      General: She is in acute distress.     Appearance: She is well-developed. She is not ill-appearing, toxic-appearing or diaphoretic.  HENT:     Head: Normocephalic and atraumatic.     Right Ear: External ear normal.     Left  Ear: External ear normal.  Eyes:     Conjunctiva/sclera: Conjunctivae normal.     Pupils: Pupils are equal, round, and reactive to light.  Neck:     Trachea: Phonation normal.  Cardiovascular:     Rate and Rhythm: Normal rate.  Pulmonary:     Effort: Pulmonary effort is normal.  Abdominal:     General: There is no distension.  Musculoskeletal:        General: Normal range of motion.     Cervical back: Normal range of motion and neck supple.     Comments: She guards against any movement of the right shoulder or neck secondary to pain.  Skin:    General: Skin is warm and dry.  Neurological:     Mental Status: She is alert and oriented to person, place, and time.     Cranial Nerves:  No cranial nerve deficit.     Sensory: No sensory deficit.     Motor: No abnormal muscle tone.     Coordination: Coordination normal.     Comments: Intact bilateral hand touch sensation.  Psychiatric:        Mood and Affect: Mood normal.        Behavior: Behavior normal.        Thought Content: Thought content normal.        Judgment: Judgment normal.     ED Results / Procedures / Treatments   Labs (all labs ordered are listed, but only abnormal results are displayed) Labs Reviewed  BASIC METABOLIC PANEL - Abnormal; Notable for the following components:      Result Value   Sodium 125 (*)    Chloride 87 (*)    Glucose, Bld 206 (*)    All other components within normal limits  CBC WITH DIFFERENTIAL/PLATELET - Abnormal; Notable for the following components:   WBC 11.9 (*)    Platelets 505 (*)    Neutro Abs 10.4 (*)    All other components within normal limits  SEDIMENTATION RATE - Abnormal; Notable for the following components:   Sed Rate 56 (*)    All other components within normal limits  BASIC METABOLIC PANEL - Abnormal; Notable for the following components:   Sodium 128 (*)    Chloride 90 (*)    Glucose, Bld 145 (*)    BUN 7 (*)    All other components within normal limits  HIV ANTIBODY (ROUTINE TESTING W REFLEX)    EKG None  Radiology MR Cervical Spine Wo Contrast  Result Date: 07/22/2021 CLINICAL DATA:  Neck pain, chronic, degenerative changes on xray. Severe right-sided neck pain with bilateral arm tingling, exacerbated by movement. EXAM: MRI CERVICAL SPINE WITHOUT CONTRAST TECHNIQUE: Multiplanar, multisequence MR imaging of the cervical spine was performed. No intravenous contrast was administered. COMPARISON:  None Available. FINDINGS: The study is mildly motion degraded throughout. Alignment: Reversal of the normal cervical lordosis. Minimal anterolisthesis of C3 on C4 and C4 on C5. Vertebrae: No fracture or suspicious marrow lesion. Facet edema on the left at  C3-4 and on the right at C4-5, likely degenerative. Widespread degenerative endplate changes in the cervical and upper thoracic spine including mild multilevel degenerative endplate edema. Cord: Normal signal. Posterior Fossa, vertebral arteries, paraspinal tissues: Unremarkable. Disc levels: Moderate disc space narrowing at C2-3, C4-5, and T1-2 and severe narrowing at C5-6 and C6-7. C2-3: Disc bulging, uncovertebral spurring, and mild facet arthrosis without significant stenosis. C3-4: Anterolisthesis with bulging uncovered disc, uncovertebral spurring, and mild right and severe  left facet arthrosis result in severe left neural foraminal stenosis without significant spinal stenosis. C4-5: Anterolisthesis with bulging uncovered disc, uncovertebral spurring, and severe facet arthrosis result in mild spinal stenosis and severe right and mild left neural foraminal stenosis. C5-6: Broad-based posterior disc osteophyte complex, left paracentral disc protrusion, and mild facet arthrosis result in mild to moderate left-sided spinal stenosis with mild cord flattening and moderate right and mild left neural foraminal stenosis. C6-7: A broad-based posterior disc osteophyte complex and left central disc extrusion with caudal migration result in moderate spinal stenosis with mild cord flattening and moderate right and mild left neural foraminal stenosis. C7-T1: Disc bulging, uncovertebral spurring, and moderate facet arthrosis result in mild spinal stenosis and mild right neural foraminal stenosis. T1-2: Only imaged sagittally. Disc bulging and facet arthrosis result in mild spinal stenosis and moderate to severe left greater than right neural foraminal stenosis. IMPRESSION: 1. Advanced cervical disc and facet degeneration with moderate spinal stenosis at C6-7 and mild-to-moderate spinal stenosis at C5-6. 2. Moderate to severe multilevel neural foraminal stenosis as above. Electronically Signed   By: Logan Bores M.D.   On:  07/22/2021 18:35    Procedures Procedures    Medications Ordered in ED Medications  0.9 %  sodium chloride infusion ( Intravenous New Bag/Given 07/22/21 1718)  chlorthalidone (HYGROTON) tablet 25 mg (25 mg Oral Not Given 07/23/21 1016)  cholecalciferol (VITAMIN D3) tablet 10,000 Units (10,000 Units Oral Given 07/23/21 1012)  sodium chloride flush (NS) 0.9 % injection 3 mL (3 mLs Intravenous Given 07/23/21 1013)  0.9 %  sodium chloride infusion ( Intravenous New Bag/Given 07/23/21 0420)  acetaminophen (TYLENOL) tablet 650 mg (has no administration in time range)    Or  acetaminophen (TYLENOL) suppository 650 mg (has no administration in time range)  oxyCODONE (Oxy IR/ROXICODONE) immediate release tablet 5 mg (5 mg Oral Given 07/23/21 0522)  HYDROmorphone (DILAUDID) injection 0.5-1 mg (1 mg Intravenous Given 07/23/21 0723)  methocarbamol (ROBAXIN) 500 mg in dextrose 5 % 50 mL IVPB (has no administration in time range)  ondansetron (ZOFRAN) tablet 4 mg (has no administration in time range)    Or  ondansetron (ZOFRAN) injection 4 mg (has no administration in time range)  docusate sodium (COLACE) capsule 100 mg (100 mg Oral Given 07/23/21 1012)  dexamethasone (DECADRON) injection 10 mg (10 mg Intravenous Given 07/23/21 1344)  lisinopril (ZESTRIL) tablet 20 mg (20 mg Oral Given 07/23/21 1012)    And  hydrochlorothiazide (HYDRODIURIL) tablet 12.5 mg (12.5 mg Oral Not Given 07/23/21 1012)    ED Course/ Medical Decision Making/ A&P                           Medical Decision Making Patient presenting with known degenerative joint disease of the neck and severe pain in neck and right arm.  No recent trauma.  She was treated with steroids, recently without relief of her symptoms.  He was unable to move today without assistance from her husband and remains in severe pain.  Problems Addressed: Osteoarthritis of spine with radiculopathy, cervical region: acute illness or injury    Details: Worsening  despite treatment.  Amount and/or Complexity of Data Reviewed Independent Historian:     Details: She is a cogent historian Labs: ordered. Radiology: ordered and independent interpretation performed.    Details: MRI imaging of the cervical spine indicates advanced disc disease and facet degeneration with cervical spinal stenosis of the lower cervical spine.  She  has bilateral symptoms and bilateral findings on the imaging. Discussion of management or test interpretation with external provider(s): Case discussed with neurosurgery who came to the ED, evaluated and admitted the patient.  They plan on operative management  Risk Prescription drug management. Decision regarding hospitalization. Elective major surgery with no identified risk factors. Risk Details: Patient presenting with neck pain and cervical radiculopathy.  Pain was severe and she was treated with narcotics with improvement of her discomfort.  She required evaluation by neurosurgery and was ultimately admitted by them.  She will likely progress to surgical management.           Final Clinical Impression(s) / ED Diagnoses Final diagnoses:  Osteoarthritis of spine with radiculopathy, cervical region    Rx / DC Orders ED Discharge Orders     None         Daleen Bo, MD 07/23/21 1429

## 2021-07-23 LAB — HIV ANTIBODY (ROUTINE TESTING W REFLEX): HIV Screen 4th Generation wRfx: NONREACTIVE

## 2021-07-23 LAB — BASIC METABOLIC PANEL
Anion gap: 10 (ref 5–15)
BUN: 7 mg/dL — ABNORMAL LOW (ref 8–23)
CO2: 28 mmol/L (ref 22–32)
Calcium: 9.1 mg/dL (ref 8.9–10.3)
Chloride: 90 mmol/L — ABNORMAL LOW (ref 98–111)
Creatinine, Ser: 0.7 mg/dL (ref 0.44–1.00)
GFR, Estimated: >60 mL/min (ref >60)
Glucose, Bld: 145 mg/dL — ABNORMAL HIGH (ref 70–99)
Potassium: 3.6 mmol/L (ref 3.5–5.1)
Sodium: 128 mmol/L — ABNORMAL LOW (ref 135–145)

## 2021-07-23 NOTE — Plan of Care (Signed)

## 2021-07-23 NOTE — Progress Notes (Signed)
Overall stable.  Her pain is reasonably well controlled with her current medical regimen.  She is afebrile.  Her vital signs are stable.  She is awake and alert.  Oriented and appropriate.  She has some mild right upper extremity weakness.  She has decreased sensation in both hands right greater than left.  She has some mild spastic weakness of both lower extremities.  Patient with severe cervical spondylosis with stenosis and ongoing symptoms of myelopathy and radiculopathy.  Plan 3 level anterior cervical decompression and fusion tomorrow.  Patient once again given the opportunity ask questions.  She seems to be comfortable with the plan.

## 2021-07-24 ENCOUNTER — Inpatient Hospital Stay (HOSPITAL_COMMUNITY): Payer: Managed Care, Other (non HMO)

## 2021-07-24 ENCOUNTER — Encounter (HOSPITAL_COMMUNITY): Payer: Self-pay | Admitting: Neurosurgery

## 2021-07-24 ENCOUNTER — Inpatient Hospital Stay (HOSPITAL_COMMUNITY): Payer: Managed Care, Other (non HMO) | Admitting: Anesthesiology

## 2021-07-24 ENCOUNTER — Encounter (HOSPITAL_COMMUNITY)
Admission: EM | Disposition: A | Discharge status: Home / Self Care | Payer: Self-pay | Source: Home / Self Care | Attending: Neurosurgery

## 2021-07-24 ENCOUNTER — Other Ambulatory Visit: Payer: Self-pay

## 2021-07-24 DIAGNOSIS — M4712 Other spondylosis with myelopathy, cervical region: Secondary | ICD-10-CM

## 2021-07-24 DIAGNOSIS — M4722 Other spondylosis with radiculopathy, cervical region: Secondary | ICD-10-CM

## 2021-07-24 HISTORY — PX: ANTERIOR CERVICAL DECOMP/DISCECTOMY FUSION: SHX1161

## 2021-07-24 LAB — TYPE AND SCREEN
ABO/RH(D): A POS
Antibody Screen: NEGATIVE

## 2021-07-24 LAB — SURGICAL PCR SCREEN
MRSA, PCR: NEGATIVE
Staphylococcus aureus: NEGATIVE

## 2021-07-24 LAB — GLUCOSE, CAPILLARY: Glucose-Capillary: 132 mg/dL — ABNORMAL HIGH (ref 70–99)

## 2021-07-24 SURGERY — ANTERIOR CERVICAL DECOMPRESSION/DISCECTOMY FUSION 3 LEVELS
Anesthesia: General

## 2021-07-24 MED ORDER — AMISULPRIDE (ANTIEMETIC) 5 MG/2ML IV SOLN: 10.0000 mg | Freq: Once | INTRAVENOUS | Status: DC | PRN

## 2021-07-24 MED ORDER — ONDANSETRON HCL 4 MG/2ML IJ SOLN
4.0000 mg | Freq: Four times a day (QID) | INTRAMUSCULAR | Status: DC | PRN
Start: 2021-07-24 — End: 2021-07-25

## 2021-07-24 MED ORDER — HYDROMORPHONE HCL 1 MG/ML IJ SOLN
1.0000 mg | INTRAMUSCULAR | Status: DC | PRN
  Administered 2021-07-24: 1 mg via INTRAVENOUS
  Filled 2021-07-24: qty 1

## 2021-07-24 MED ORDER — THROMBIN 5000 UNITS EX SOLR
CUTANEOUS | Status: AC
  Filled 2021-07-24: qty 5000

## 2021-07-24 MED ORDER — THROMBIN 20000 UNITS EX SOLR
CUTANEOUS | Status: DC | PRN
  Administered 2021-07-24: 20 mL via TOPICAL

## 2021-07-24 MED ORDER — CELECOXIB 200 MG PO CAPS
ORAL_CAPSULE | ORAL | Status: AC
  Administered 2021-07-24: 200 mg via ORAL
  Filled 2021-07-24: qty 1

## 2021-07-24 MED ORDER — HYDROCODONE-ACETAMINOPHEN 10-325 MG PO TABS
2.0000 | ORAL_TABLET | ORAL | Status: DC | PRN
  Administered 2021-07-25: 2 via ORAL
  Filled 2021-07-24: qty 2

## 2021-07-24 MED ORDER — FENTANYL CITRATE (PF) 250 MCG/5ML IJ SOLN
INTRAMUSCULAR | Status: DC | PRN
Start: 2021-07-24 — End: 2021-07-24
  Administered 2021-07-24: 50 ug via INTRAVENOUS
  Administered 2021-07-24: 150 ug via INTRAVENOUS
  Administered 2021-07-24: 100 ug via INTRAVENOUS

## 2021-07-24 MED ORDER — FENTANYL CITRATE (PF) 100 MCG/2ML IJ SOLN
INTRAMUSCULAR | Status: AC
  Filled 2021-07-24: qty 2

## 2021-07-24 MED ORDER — MENTHOL 3 MG MT LOZG
1.0000 | LOZENGE | OROMUCOSAL | Status: DC | PRN
  Filled 2021-07-24: qty 9

## 2021-07-24 MED ORDER — ROCURONIUM BROMIDE 10 MG/ML (PF) SYRINGE
PREFILLED_SYRINGE | INTRAVENOUS | Status: DC | PRN
  Administered 2021-07-24: 10 mg via INTRAVENOUS
  Administered 2021-07-24: 60 mg via INTRAVENOUS

## 2021-07-24 MED ORDER — CYCLOBENZAPRINE HCL 10 MG PO TABS
10.0000 mg | ORAL_TABLET | Freq: Three times a day (TID) | ORAL | Status: DC | PRN
  Administered 2021-07-24: 10 mg via ORAL
  Filled 2021-07-24: qty 1

## 2021-07-24 MED ORDER — ACETAMINOPHEN 325 MG PO TABS: 650.0000 mg | ORAL_TABLET | ORAL | Status: DC | PRN

## 2021-07-24 MED ORDER — THROMBIN 20000 UNITS EX SOLR
CUTANEOUS | Status: AC
  Filled 2021-07-24: qty 20000

## 2021-07-24 MED ORDER — 0.9 % SODIUM CHLORIDE (POUR BTL) OPTIME
TOPICAL | Status: DC | PRN
  Administered 2021-07-24: 1000 mL

## 2021-07-24 MED ORDER — CEFAZOLIN SODIUM-DEXTROSE 1-4 GM/50ML-% IV SOLN
1.0000 g | Freq: Three times a day (TID) | INTRAVENOUS | Status: AC
  Administered 2021-07-24 – 2021-07-25 (×2): 1 g via INTRAVENOUS
  Filled 2021-07-24 (×2): qty 50

## 2021-07-24 MED ORDER — SODIUM CHLORIDE 0.9% FLUSH: 3.0000 mL | INTRAVENOUS | Status: DC | PRN

## 2021-07-24 MED ORDER — FENTANYL CITRATE (PF) 250 MCG/5ML IJ SOLN
INTRAMUSCULAR | Status: AC
  Filled 2021-07-24: qty 5

## 2021-07-24 MED ORDER — ACETAMINOPHEN 500 MG PO TABS: 1000.0000 mg | ORAL_TABLET | Freq: Once | ORAL | Status: AC

## 2021-07-24 MED ORDER — EPHEDRINE 5 MG/ML INJ
INTRAVENOUS | Status: AC
  Filled 2021-07-24: qty 5

## 2021-07-24 MED ORDER — MIDAZOLAM HCL 2 MG/2ML IJ SOLN
INTRAMUSCULAR | Status: AC
  Filled 2021-07-24: qty 2

## 2021-07-24 MED ORDER — PHENOL 1.4 % MT LIQD: 1.0000 | OROMUCOSAL | Status: DC | PRN

## 2021-07-24 MED ORDER — CHLORHEXIDINE GLUCONATE 0.12 % MT SOLN
OROMUCOSAL | Status: AC
  Administered 2021-07-24: 15 mL via OROMUCOSAL
  Filled 2021-07-24: qty 15

## 2021-07-24 MED ORDER — ACETAMINOPHEN 500 MG PO TABS
ORAL_TABLET | ORAL | Status: AC
  Administered 2021-07-24: 1000 mg via ORAL
  Filled 2021-07-24: qty 2

## 2021-07-24 MED ORDER — SUCCINYLCHOLINE CHLORIDE 200 MG/10ML IV SOSY: PREFILLED_SYRINGE | INTRAVENOUS | Status: DC | PRN

## 2021-07-24 MED ORDER — PROMETHAZINE HCL 25 MG/ML IJ SOLN: 6.2500 mg | INTRAMUSCULAR | Status: DC | PRN

## 2021-07-24 MED ORDER — SCOPOLAMINE 1 MG/3DAYS TD PT72: 1.0000 | MEDICATED_PATCH | TRANSDERMAL | Status: DC

## 2021-07-24 MED ORDER — FENTANYL CITRATE (PF) 100 MCG/2ML IJ SOLN
25.0000 ug | INTRAMUSCULAR | Status: DC | PRN
  Administered 2021-07-24: 25 ug via INTRAVENOUS
  Administered 2021-07-24: 50 ug via INTRAVENOUS

## 2021-07-24 MED ORDER — SODIUM CHLORIDE 0.9% FLUSH
3.0000 mL | Freq: Two times a day (BID) | INTRAVENOUS | Status: DC
  Administered 2021-07-25: 3 mL via INTRAVENOUS

## 2021-07-24 MED ORDER — SODIUM CHLORIDE 0.9 % IV SOLN: 250.0000 mL | INTRAVENOUS | Status: DC

## 2021-07-24 MED ORDER — ONDANSETRON HCL 4 MG/2ML IJ SOLN
INTRAMUSCULAR | Status: DC | PRN
  Administered 2021-07-24: 4 mg via INTRAVENOUS

## 2021-07-24 MED ORDER — SUGAMMADEX SODIUM 200 MG/2ML IV SOLN
INTRAVENOUS | Status: DC | PRN
  Administered 2021-07-24: 200 mg via INTRAVENOUS

## 2021-07-24 MED ORDER — LACTATED RINGERS IV SOLN: INTRAVENOUS | Status: DC

## 2021-07-24 MED ORDER — CHLORHEXIDINE GLUCONATE 0.12 % MT SOLN
15.0000 mL | Freq: Once | OROMUCOSAL | Status: AC
Start: 2021-07-24 — End: 2021-07-24

## 2021-07-24 MED ORDER — LIDOCAINE 2% (20 MG/ML) 5 ML SYRINGE
INTRAMUSCULAR | Status: AC
  Filled 2021-07-24: qty 5

## 2021-07-24 MED ORDER — ONDANSETRON HCL 4 MG PO TABS: 4.0000 mg | ORAL_TABLET | Freq: Four times a day (QID) | ORAL | Status: DC | PRN

## 2021-07-24 MED ORDER — SCOPOLAMINE 1 MG/3DAYS TD PT72
MEDICATED_PATCH | TRANSDERMAL | Status: AC
  Administered 2021-07-24: 1.5 mg via TRANSDERMAL
  Filled 2021-07-24: qty 1

## 2021-07-24 MED ORDER — CELECOXIB 200 MG PO CAPS: 200.0000 mg | ORAL_CAPSULE | Freq: Once | ORAL | Status: AC

## 2021-07-24 MED ORDER — HYDROCODONE-ACETAMINOPHEN 5-325 MG PO TABS: 1.0000 | ORAL_TABLET | ORAL | Status: DC | PRN

## 2021-07-24 MED ORDER — ACETAMINOPHEN 650 MG RE SUPP: 650.0000 mg | RECTAL | Status: DC | PRN

## 2021-07-24 MED ORDER — ORAL CARE MOUTH RINSE: 15.0000 mL | Freq: Once | OROMUCOSAL | Status: AC

## 2021-07-24 MED ORDER — MIDAZOLAM HCL 2 MG/2ML IJ SOLN
INTRAMUSCULAR | Status: DC | PRN
  Administered 2021-07-24: 2 mg via INTRAVENOUS

## 2021-07-24 MED ORDER — KETOROLAC TROMETHAMINE 0.5 % OP SOLN
1.0000 [drp] | Freq: Three times a day (TID) | OPHTHALMIC | Status: DC
  Administered 2021-07-24: 1 [drp] via OPHTHALMIC
  Filled 2021-07-24: qty 5

## 2021-07-24 MED ORDER — DEXAMETHASONE SODIUM PHOSPHATE 10 MG/ML IJ SOLN
INTRAMUSCULAR | Status: AC
  Filled 2021-07-24: qty 3

## 2021-07-24 MED ORDER — CEFAZOLIN SODIUM-DEXTROSE 2-4 GM/100ML-% IV SOLN
2.0000 g | INTRAVENOUS | Status: AC
  Administered 2021-07-24: 2 g via INTRAVENOUS
  Filled 2021-07-24: qty 100

## 2021-07-24 MED ORDER — LIDOCAINE 2% (20 MG/ML) 5 ML SYRINGE
INTRAMUSCULAR | Status: DC | PRN
  Administered 2021-07-24: 30 mg via INTRAVENOUS

## 2021-07-24 MED ORDER — THROMBIN 5000 UNITS EX SOLR
OROMUCOSAL | Status: DC | PRN
  Administered 2021-07-24: 5 mL via TOPICAL

## 2021-07-24 MED ORDER — ONDANSETRON HCL 4 MG/2ML IJ SOLN
INTRAMUSCULAR | Status: AC
  Filled 2021-07-24: qty 2

## 2021-07-24 MED ORDER — DEXAMETHASONE SODIUM PHOSPHATE 10 MG/ML IJ SOLN
INTRAMUSCULAR | Status: DC | PRN
  Administered 2021-07-24: 10 mg via INTRAVENOUS

## 2021-07-24 MED ORDER — ROCURONIUM BROMIDE 10 MG/ML (PF) SYRINGE
PREFILLED_SYRINGE | INTRAVENOUS | Status: AC
  Filled 2021-07-24: qty 10

## 2021-07-24 SURGICAL SUPPLY — 59 items
APL SKNCLS STERI-STRIP NONHPOA (GAUZE/BANDAGES/DRESSINGS) ×1
BAG COUNTER SPONGE SURGICOUNT (BAG) ×4 IMPLANT
BAG DECANTER FOR FLEXI CONT (MISCELLANEOUS) ×2 IMPLANT
BAG SPNG CNTER NS LX DISP (BAG) ×2
BAND INSRT 18 STRL LF DISP RB (MISCELLANEOUS) ×2
BAND RUBBER #18 3X1/16 STRL (MISCELLANEOUS) ×6 IMPLANT
BENZOIN TINCTURE PRP APPL 2/3 (GAUZE/BANDAGES/DRESSINGS) ×3 IMPLANT
BUR MATCHSTICK NEURO 3.0 LAGG (BURR) ×3 IMPLANT
CAGE PEEK 6X14X11 (Cage) ×4 IMPLANT
CAGE PEEK 7X14X11 (Cage) ×2 IMPLANT
CANISTER SUCT 3000ML PPV (MISCELLANEOUS) ×3 IMPLANT
CARTRIDGE OIL MAESTRO DRILL (MISCELLANEOUS) ×2 IMPLANT
DIFFUSER DRILL AIR PNEUMATIC (MISCELLANEOUS) ×3 IMPLANT
DRAPE C-ARM 42X72 X-RAY (DRAPES) ×6 IMPLANT
DRAPE LAPAROTOMY 100X72 PEDS (DRAPES) ×3 IMPLANT
DRAPE MICROSCOPE LEICA (MISCELLANEOUS) ×3 IMPLANT
DURAPREP 6ML APPLICATOR 50/CS (WOUND CARE) ×3 IMPLANT
ELECT COATED BLADE 2.86 ST (ELECTRODE) ×3 IMPLANT
ELECT REM PT RETURN 9FT ADLT (ELECTROSURGICAL) ×2
ELECTRODE REM PT RTRN 9FT ADLT (ELECTROSURGICAL) ×2 IMPLANT
GAUZE 4X4 16PLY ~~LOC~~+RFID DBL (SPONGE) IMPLANT
GAUZE SPONGE 4X4 12PLY STRL (GAUZE/BANDAGES/DRESSINGS) ×3 IMPLANT
GAUZE SPONGE 4X4 12PLY STRL LF (GAUZE/BANDAGES/DRESSINGS) ×1 IMPLANT
GLOVE ECLIPSE 9.0 STRL (GLOVE) ×3 IMPLANT
GLOVE EXAM NITRILE XL STR (GLOVE) IMPLANT
GOWN STRL REUS W/ TWL LRG LVL3 (GOWN DISPOSABLE) IMPLANT
GOWN STRL REUS W/ TWL XL LVL3 (GOWN DISPOSABLE) IMPLANT
GOWN STRL REUS W/TWL 2XL LVL3 (GOWN DISPOSABLE) IMPLANT
GOWN STRL REUS W/TWL LRG LVL3 (GOWN DISPOSABLE)
GOWN STRL REUS W/TWL XL LVL3 (GOWN DISPOSABLE)
HALTER HD/CHIN CERV TRACTION D (MISCELLANEOUS) ×3 IMPLANT
HEMOSTAT POWDER KIT SURGIFOAM (HEMOSTASIS) ×3 IMPLANT
KIT BASIN OR (CUSTOM PROCEDURE TRAY) ×3 IMPLANT
KIT TURNOVER KIT B (KITS) ×3 IMPLANT
NDL SPNL 20GX3.5 QUINCKE YW (NEEDLE) ×2 IMPLANT
NEEDLE SPNL 20GX3.5 QUINCKE YW (NEEDLE) ×2 IMPLANT
NS IRRIG 1000ML POUR BTL (IV SOLUTION) ×3 IMPLANT
OIL CARTRIDGE MAESTRO DRILL (MISCELLANEOUS) ×2
PACK LAMINECTOMY NEURO (CUSTOM PROCEDURE TRAY) ×3 IMPLANT
PAD ARMBOARD 7.5X6 YLW CONV (MISCELLANEOUS) ×9 IMPLANT
PLATE 3 55XLCK NS SPNE CVD (Plate) IMPLANT
PLATE 3 ATLANTIS TRANS (Plate) ×2 IMPLANT
SCREW ST FIX 4 ATL 3120213 (Screw) ×8 IMPLANT
SPACER SPNL 11X14X6XPEEK CVD (Cage) IMPLANT
SPACER SPNL 11X14X7XPEEK CVD (Cage) IMPLANT
SPCR SPNL 11X14X6XPEEK CVD (Cage) ×2 IMPLANT
SPCR SPNL 11X14X7XPEEK CVD (Cage) ×1 IMPLANT
SPONGE INTESTINAL PEANUT (DISPOSABLE) ×3 IMPLANT
SPONGE SURGIFOAM ABS GEL 100 (HEMOSTASIS) ×3 IMPLANT
STRIP CLOSURE SKIN 1/2X4 (GAUZE/BANDAGES/DRESSINGS) ×3 IMPLANT
STRIP CLOSURE SKIN 1/4X3 (GAUZE/BANDAGES/DRESSINGS) ×1 IMPLANT
SUT VIC AB 3-0 SH 8-18 (SUTURE) ×3 IMPLANT
SUT VIC AB 4-0 RB1 18 (SUTURE) ×3 IMPLANT
TAPE CLOTH 4X10 WHT NS (GAUZE/BANDAGES/DRESSINGS) ×3 IMPLANT
TAPE PAPER 3X10 WHT MICROPORE (GAUZE/BANDAGES/DRESSINGS) ×1 IMPLANT
TOWEL GREEN STERILE (TOWEL DISPOSABLE) ×3 IMPLANT
TOWEL GREEN STERILE FF (TOWEL DISPOSABLE) ×3 IMPLANT
TRAP SPECIMEN MUCUS 40CC (MISCELLANEOUS) ×3 IMPLANT
WATER STERILE IRR 1000ML POUR (IV SOLUTION) ×3 IMPLANT

## 2021-07-24 NOTE — Transfer of Care (Signed)
Immediate Anesthesia Transfer of Care Note  Patient: Sara Werner  Procedure(s) Performed: ANTERIOR CERVICAL DECOMPRESSION/DISCECTOMY FUSION CERVICAL FOUR-FIVE, CERVICAL FIVE-SIX, CERVICAL SIX-SEVEN  Patient Location: PACU  Anesthesia Type:General  Level of Consciousness: awake and alert   Airway & Oxygen Therapy: Patient Spontanous Breathing and Patient connected to nasal cannula oxygen  Post-op Assessment: Report given to RN and Post -op Vital signs reviewed and stable  Post vital signs: Reviewed and stable  Last Vitals:  Vitals Value Taken Time  BP 146/84 07/24/21 1913  Temp    Pulse 77 07/24/21 1916  Resp 12 07/24/21 1916  SpO2 98 % 07/24/21 1916  Vitals shown include unvalidated device data.  Last Pain:  Vitals:   07/24/21 1203  TempSrc:   PainSc: 3       Patients Stated Pain Goal: 0 (59/16/38 4665)  Complications: No notable events documented.

## 2021-07-24 NOTE — Anesthesia Procedure Notes (Signed)
Procedure Name: Intubation Date/Time: 07/24/2021 4:37 PM  Performed by: Eligha Bridegroom, CRNAPre-anesthesia Checklist: Patient identified, Emergency Drugs available, Suction available, Patient being monitored and Timeout performed Patient Re-evaluated:Patient Re-evaluated prior to induction Oxygen Delivery Method: Circle system utilized Preoxygenation: Pre-oxygenation with 100% oxygen Induction Type: IV induction Ventilation: Mask ventilation without difficulty Laryngoscope Size: Glidescope Grade View: Grade II Tube type: Oral Tube size: 7.0 mm Number of attempts: 1 Airway Equipment and Method: Stylet Placement Confirmation: ETT inserted through vocal cords under direct vision, positive ETCO2 and breath sounds checked- equal and bilateral Secured at: 21 cm Tube secured with: Tape Dental Injury: Teeth and Oropharynx as per pre-operative assessment

## 2021-07-24 NOTE — Op Note (Signed)
Date of procedure: 07/24/2021  Date of dictation: Same  Service: Neurosurgery  Preoperative diagnosis: Cervical spondylosis with myelopathy and radiculopathy  Postoperative diagnosis: Same  Procedure Name: C4-5, C5-6, C6-7 anterior cervical discectomy with interbody fusion utilizing interbody peek cage, locally harvested autograft, and anterior plate instrumentation  Surgeon:Kaydense Rizo A.Jaqwon Manfred, M.D.  Asst. Surgeon: Reinaldo Meeker, NP  Anesthesia: General  Indication: 61 year old female admitted through the emergency room for intractable severe pain with progressive weakness and sensory loss.  Work-up demonstrates evidence of severe cervical spondylosis with severe cord compression at C5-6 and C6-7 and severe neuroforaminal stenosis at C4-5, C5-6 and C6-7.  Patient has been given options with regard to operative and nonoperative care.  She is decided move forward with 3 level anterior cervical decompression and fusion in hopes of improving her symptoms.  Operative note: After induction anesthesia, patient positioned supine with neck slightly extended and held placed halter traction.  Patient's anterior cervical region prepped and draped sterilely.  Incision made overlying C5-6.  Dissection performed on the right.  Retractor placed.  Fluoroscopy used.  Levels confirmed.  Disc bases at all 3 levels were incised.  Discectomies were then performed with various instruments down to level of the posterior annulus.  The microscope was then brought into the field used throughout the remainder of the discectomy.  Remaining aspects of annulus and osteophytes were removed using the high-speed drill down to level of the posterior longitudinal ligament.  Posterior logical limb was then elevated and resected.  Underlying thecal sac was identified.  A wide central decompression then performed undercutting the bodies of C4 and C5.  Decompression then proceeded to the external foramina.  Wide anterior foraminotomies were  performed on the course the exiting C5 nerve roots bilaterally.  At this point a very thorough decompression had been achieved.  There was no evidence of injury to the thecal sac and nerve roots.  Procedure was then repeated at C5-6 and C6-7 again without complications.  Findings at C5-6 and C6-7 were that of a large paracentral disc protrusions with severe stenosis.  Wound was then irrigated.  Gelfoam was placed topically for hemostasis then removed.  [employer] anatomic peek cages were then packed with locally harvested autograft.  Cage was then impacted in the place at all 3 levels.  Each cage was recessed slightly from the anterior cortical margin.  Atlantis translational anterior cervical plate was then placed over the C4, C5, C6 and C7 levels.  This then attached under fluoroscopic guidance using 13 mm fixed angle screws to each at all 4 levels.  All screws given final tightening.  Locking screws engaged at all levels.  Final images reveal good position of the hardware and cages at the proper operative level with normal alignment of the spine.  Wound was then inspected for hemostasis which was found to be good.  Wound is then closed in layers.  Steri-Strips and sterile dressing were applied.  No apparent complications.  Patient tolerated the procedure well and she returns to the recovery room postop.

## 2021-07-24 NOTE — Brief Op Note (Signed)
07/22/2021 - 07/24/2021  6:50 PM  PATIENT:  Sara Werner  61 y.o. female  PRE-OPERATIVE DIAGNOSIS:  cervical spondylosis with myelopathy and radiculopathy  POST-OPERATIVE DIAGNOSIS:  cervical spondylosis with myelopathy and radiculopathy  PROCEDURE:  Procedure(s): ANTERIOR CERVICAL DECOMPRESSION/DISCECTOMY FUSION CERVICAL FOUR-FIVE, CERVICAL FIVE-SIX, CERVICAL SIX-SEVEN (N/A)  SURGEON:  Surgeon(s) and Role:    * Earnie Larsson, MD - Primary  PHYSICIAN ASSISTANT:   ASSISTANTSMearl Latin   ANESTHESIA:   general  EBL:  150 mL   BLOOD ADMINISTERED:none  DRAINS: none   LOCAL MEDICATIONS USED:  NONE  SPECIMEN:  No Specimen  DISPOSITION OF SPECIMEN:  N/A  COUNTS:  YES  TOURNIQUET:  * No tourniquets in log *  DICTATION: .Dragon Dictation  PLAN OF CARE: Admit to inpatient   PATIENT DISPOSITION:  PACU - hemodynamically stable.   Delay start of Pharmacological VTE agent (>24hrs) due to surgical blood loss or risk of bleeding: yes

## 2021-07-24 NOTE — Progress Notes (Signed)
Orthopedic Tech Progress Note Patient Details:  Sara Werner 09-29-1960 [id number]  Ortho Devices Type of Ortho Device: Soft collar Ortho Device/Splint Interventions: Ordered, Application, Adjustment   Post Interventions Patient Tolerated: Well Instructions Provided: Care of device, Adjustment of device  [name] 07/24/2021, 8:54 PM

## 2021-07-24 NOTE — Anesthesia Preprocedure Evaluation (Addendum)
Anesthesia Evaluation  Patient identified by MRN, date of birth, ID band Patient awake    Reviewed: Allergy & Precautions, H&P , NPO status , Patient's Chart, lab work & pertinent test results  History of Anesthesia Complications (+) PONV and history of anesthetic complications  Airway Mallampati: II  TM Distance: >3 FB Neck ROM: Full    Dental no notable dental hx. (+) Dental Advisory Given, Teeth Intact   Pulmonary neg pulmonary ROS,    Pulmonary exam normal breath sounds clear to auscultation       Cardiovascular hypertension, Pt. on medications Normal cardiovascular exam Rhythm:Regular Rate:Normal     Neuro/Psych negative neurological ROS  negative psych ROS   GI/Hepatic negative GI ROS, Neg liver ROS,   Endo/Other  negative endocrine ROS  Renal/GU negative Renal ROS  negative genitourinary   Musculoskeletal negative musculoskeletal ROS (+)   Abdominal   Peds  Hematology negative hematology ROS (+)   Anesthesia Other Findings Day of surgery medications reviewed with the patient.  Reproductive/Obstetrics negative OB ROS                            Anesthesia Physical  Anesthesia Plan  ASA: 2  Anesthesia Plan: General   Post-op Pain Management: Celebrex PO (pre-op)* and Tylenol PO (pre-op)*   Induction: Intravenous  PONV Risk Score and Plan: 4 or greater and Ondansetron, Dexamethasone, Midazolam, Scopolamine patch - Pre-op, Treatment may vary due to age or medical condition and TIVA  Airway Management Planned: Oral ETT and Video Laryngoscope Planned  Additional Equipment: None  Intra-op Plan:   Post-operative Plan: Extubation in OR  Informed Consent: I have reviewed the patients History and Physical, chart, labs and discussed the procedure including the risks, benefits and alternatives for the proposed anesthesia with the patient or authorized representative who has  indicated his/her understanding and acceptance.     Dental advisory given  Plan Discussed with: Anesthesiologist and CRNA  Anesthesia Plan Comments:       Anesthesia Quick Evaluation

## 2021-07-24 NOTE — Interval H&P Note (Signed)
History and Physical Interval Note:  07/24/2021 4:08 PM  Sara Werner  has presented today for surgery, with the diagnosis of cervical spondylosis with myelopathy and radiculopathy.  The various methods of treatment have been discussed with the patient and family. After consideration of risks, benefits and other options for treatment, the patient has consented to  Procedure(s): ANTERIOR CERVICAL DECOMPRESSION/DISCECTOMY FUSION 3 LEVELS (N/A) as a surgical intervention.  The patient's history has been reviewed, patient examined, no change in status, stable for surgery.  I have reviewed the patient's chart and labs.  Questions were answered to the patient's satisfaction.     [name]

## 2021-07-25 MED ORDER — LORATADINE 10 MG PO TABS
10.0000 mg | ORAL_TABLET | Freq: Every day | ORAL | Status: DC
  Administered 2021-07-25: 10 mg via ORAL
  Filled 2021-07-25: qty 1

## 2021-07-25 MED ORDER — HYDROCODONE-ACETAMINOPHEN 5-325 MG PO TABS: 1.0000 | ORAL_TABLET | ORAL | 0 refills | Status: AC | PRN

## 2021-07-25 NOTE — Plan of Care (Signed)
  Problem: Education: Goal: Ability to verbalize activity precautions or restrictions will improve Outcome: Completed/Met Goal: Knowledge of the prescribed therapeutic regimen will improve Outcome: Completed/Met Goal: Understanding of discharge needs will improve Outcome: Completed/Met   Problem: Activity: Goal: Ability to avoid complications of mobility impairment will improve Outcome: Completed/Met Goal: Ability to tolerate increased activity will improve Outcome: Completed/Met Goal: Will remain free from falls Outcome: Completed/Met   Problem: Bowel/Gastric: Goal: Gastrointestinal status for postoperative course will improve Outcome: Completed/Met   Problem: Clinical Measurements: Goal: Ability to maintain clinical measurements within normal limits will improve Outcome: Completed/Met Goal: Postoperative complications will be avoided or minimized Outcome: Completed/Met Goal: Diagnostic test results will improve Outcome: Completed/Met   Problem: Pain Management: Goal: Pain level will decrease Outcome: Completed/Met   Problem: Health Behavior/Discharge Planning: Goal: Identification of resources available to assist in meeting health care needs will improve Outcome: Completed/Met   Problem: Bladder/Genitourinary: Goal: Urinary functional status for postoperative course will improve Outcome: Completed/Met

## 2021-07-25 NOTE — Evaluation (Signed)
Occupational Therapy Evaluation Patient Details Name: Sara Werner MRN: [id number] DOB: 16-Oct-1960 Today's Date: 07/25/2021   History of Present Illness 61 yo female s/p ACDF at C3. PMH including cancer, HTN, PNOV, and R rotator cuff repair.   Clinical Impression   PTA, pt was living with her husband and was independent. Currently, pt performing ADLs and functional mobility at Mod I level. Provided education and handout on cervical precautions, bed mobility, collar management, grooming, UB ADLs, LB ADLs, toilet transfer, shower transfer, and stair management; pt demonstrated understanding. Answered all pt questions. Recommend dc home once medically stable per physician. All acute OT needs met and will sign off. Thank you.      Recommendations for follow up therapy are one component of a multi-disciplinary discharge planning process, led by the attending physician.  Recommendations may be updated based on patient status, additional functional criteria and insurance authorization.   Follow Up Recommendations  No OT follow up (Discussed that if R hand FM skills do not improve; request)    Assistance Recommended at Discharge PRN  Patient can return home with the following      Functional Status Assessment  Patient has had a recent decline in their functional status and demonstrates the ability to make significant improvements in function in a reasonable and predictable amount of time.  Equipment Recommendations  None recommended by OT    Recommendations for Other Services       Precautions / Restrictions Precautions Precautions: Cervical;Fall Precaution Booklet Issued: Yes (comment) Restrictions Weight Bearing Restrictions: No      Mobility Bed Mobility Overal bed mobility: Independent             General bed mobility comments: Educating on log roll    Transfers Overall transfer level: Independent                        Balance Overall balance assessment:  Independent                                         ADL either performed or assessed with clinical judgement   ADL Overall ADL's : Modified independent                                       General ADL Comments: Pt performing ADLs and functional mobiltiy with increased time for adhering to cervical precautions     Vision         Perception     Praxis      Pertinent Vitals/Pain Pain Assessment Pain Assessment: Faces Faces Pain Scale: Hurts a little bit Pain Location: Neck Pain Descriptors / Indicators: Discomfort Pain Intervention(s): Monitored during session     Hand Dominance Right   Extremity/Trunk Assessment Upper Extremity Assessment Upper Extremity Assessment: RUE deficits/detail RUE Deficits / Details: WFL strength. Reports slightly numbness at finger tips, but improved compared to prior to sx. Minor decrease in finger opposition smoothness. RUE Coordination: decreased fine motor   Lower Extremity Assessment Lower Extremity Assessment: Overall WFL for tasks assessed   Cervical / Trunk Assessment Cervical / Trunk Assessment: Neck Surgery   Communication Communication Communication: No difficulties   Cognition Arousal/Alertness: Awake/alert Behavior During Therapy: WFL for tasks assessed/performed Overall Cognitive Status: Within Functional Limits for tasks assessed  General Comments       Exercises     Shoulder Instructions      Home Living Family/patient expects to be discharged to:: Private residence Living Arrangements: Spouse/significant other;Children Available Help at Discharge: Family;Available 24 hours/day Type of Home: House Home Access: Level entry;Stairs to enter Entrance Stairs-Number of Steps: Garage level entry; two at front   Home Layout: Two level;Able to live on main level with bedroom/bathroom     Bathroom Shower/Tub: Medical illustrator: Standard     Home Equipment: Shower seat;Hand held shower head          Prior Functioning/Environment Prior Level of Function : Independent/Modified Independent;Driving                        OT Problem List: Decreased knowledge of use of DME or AE;Decreased knowledge of precautions;Impaired UE functional use      OT Treatment/Interventions:      OT Goals(Current goals can be found in the care plan section) Acute Rehab OT Goals Patient Stated Goal: Go home OT Goal Formulation: All assessment and education complete, DC therapy  OT Frequency:      Co-evaluation              AM-PAC OT "6 Clicks" Daily Activity     Outcome Measure Help from another person eating meals?: None Help from another person taking care of personal grooming?: None Help from another person toileting, which includes using toliet, bedpan, or urinal?: None Help from another person bathing (including washing, rinsing, drying)?: None Help from another person to put on and taking off regular upper body clothing?: None Help from another person to put on and taking off regular lower body clothing?: None 6 Click Score: 24   End of Session Equipment Utilized During Treatment: Cervical collar Nurse Communication: Mobility status  Activity Tolerance: Patient tolerated treatment well Patient left: in chair;with call bell/phone within reach  OT Visit Diagnosis: Muscle weakness (generalized) (M62.81);Pain Pain - part of body:  (Neck)                Time: 4114-6431 OT Time Calculation (min): 19 min Charges:  OT General Charges $OT Visit: 1 Visit OT Evaluation $OT Eval Low Complexity: 1 Low  Rusty Villella MSOT, OTR/L Acute Rehab Office: [place] 07/25/2021, 12:27 PM

## 2021-07-25 NOTE — Discharge Summary (Signed)
Physician Discharge Summary  Patient ID: Sara Werner MRN: [id number] DOB/AGE: 1960/08/03 61 y.o.  Admit date: 07/22/2021 Discharge date: 07/25/2021  Admission Diagnoses:  Discharge Diagnoses:  Principal Problem:   Cervical spondylosis with myelopathy and radiculopathy   Discharged Condition: good  Hospital Course: Patient admitted to the hospital for pain control secondary to her severe cervical myelopathy and radiculopathy.  She subsequently underwent 3 level anterior cervical decompression and fusion surgery with good improvement of her symptoms.  She is currently pain-free.  She has a little bit of residual numbness in her left C6 dermatome but this is much improved.  She is standing ambulating and voiding without difficulty.  She is swallowing well.  Her voice is strong.  She is ready for discharge home.  Consults:   Significant Diagnostic Studies:   Treatments:   Discharge Exam: Blood pressure 126/79, pulse 82, temperature 98.9 F (37.2 C), temperature source Oral, resp. rate 18, height '5\' 1"'$  (1.549 m), weight 59 kg, last menstrual period 04/23/2010, SpO2 96 %. Awake and alert.  Oriented and appropriate.  Motor and sensory function intact except for some minimal right C6 dermatomal numbness.  Wound clean dry and intact.  Neck soft.  Voice strong.  Chest and abdomen benign.  Extremities free from injury or deformity.  Disposition: Discharge disposition: 01-Home or Self Care        Allergies as of 07/25/2021   No Known Allergies      Medication List     TAKE these medications    acetaminophen 500 MG tablet Commonly known as: TYLENOL Take 500 mg by mouth every 6 (six) hours as needed for moderate pain or headache.   Adderall XR 30 MG 24 hr capsule Generic drug: amphetamine-dextroamphetamine Take 30 mg by mouth daily in the afternoon.   Adderall XR 30 MG 24 hr capsule Generic drug: amphetamine-dextroamphetamine Take 1 capsule  by mouth every morning.   b  complex vitamins tablet Take 1 tablet by mouth daily.   cetirizine 10 MG tablet Commonly known as: ZYRTEC Take 10 mg by mouth daily.   chlorthalidone 50 MG tablet Commonly known as: HYGROTON Take 50 mg by mouth daily.   Estrogel 0.75 MG/1.25 GM (0.06%) topical gel Generic drug: Estradiol Place 1.25 g onto the skin daily.   HYDROcodone-acetaminophen 5-325 MG tablet Commonly known as: NORCO/VICODIN Take 1 tablet by mouth every 4 (four) hours as needed for moderate pain ((score 4 to 6)).   ibuprofen 200 MG tablet Commonly known as: ADVIL Take 400-600 mg by mouth every 6 (six) hours as needed for headache or moderate pain.   lisinopril-hydrochlorothiazide 20-12.5 MG tablet Commonly known as: ZESTORETIC Take 1 tablet by mouth daily.   methocarbamol 500 MG tablet Commonly known as: ROBAXIN Take 500 mg by mouth 2 (two) times daily as needed for muscle spasms.   OMEGA 3 PO Take 1,285 mg by mouth daily.   predniSONE 10 MG tablet Commonly known as: DELTASONE Take 10 mg by mouth See admin instructions. 3 tabs x 2 days, 2 tabs x 5 days, 1 tab qd until gone   REFRESH TEARS OP Place 1 drop into both eyes daily as needed (dry eyes).   Vitamin D3 125 MCG (5000 UT) Caps Take 10,000 Units by mouth daily.         Signed: Cooper Render Jaylnn Ullery 07/25/2021, 8:22 AM

## 2021-07-25 NOTE — Discharge Instructions (Addendum)
Wound Care You may shower Keep incision dry. Do not put any creams, lotions, or ointments on incision. Leave steri-strips on neck.  They will fall off by themselves. Activity Walk each and every day, increasing distance each day. No lifting greater than 5 lbs.  Avoid excessive neck motion. No driving for 2 weeks; may ride as a passenger locally.  Diet Resume your normal diet.  Return to Work Will be discussed at you follow up appointment. Call Your Doctor If Any of These Occur Redness, drainage, or swelling at the wound.  Temperature greater than 101 degrees. Severe pain not relieved by pain medication. Incision starts to come apart. Follow Up Appt Call today for appointment in 1-2 weeks ([phone]) or for problems.  If you have any hardware placed in your spine, you will need an x-ray before your appointment.

## 2021-07-25 NOTE — Progress Notes (Signed)
Patient alert and oriented, mae's well, voiding adequate amount of urine, swallowing without difficulty, no c/o pain at time of discharge. Patient discharged home with family. Script and discharged instructions given to patient. Patient and family stated understanding of instructions given. Patient has an appointment with Dr. Pool  

## 2021-07-26 NOTE — Anesthesia Postprocedure Evaluation (Signed)
Anesthesia Post Note  Patient: Sara Werner  Procedure(s) Performed: ANTERIOR CERVICAL DECOMPRESSION/DISCECTOMY FUSION CERVICAL FOUR-FIVE, CERVICAL FIVE-SIX, CERVICAL SIX-SEVEN     Patient location during evaluation: PACU Anesthesia Type: General Level of consciousness: awake and alert Pain management: pain level controlled Vital Signs Assessment: post-procedure vital signs reviewed and stable Respiratory status: spontaneous breathing, nonlabored ventilation, respiratory function stable and patient connected to nasal cannula oxygen Cardiovascular status: blood pressure returned to baseline and stable Postop Assessment: no apparent nausea or vomiting Anesthetic complications: no   No notable events documented.  Last Vitals:  Vitals:   07/25/21 0310 07/25/21 0726  BP: 129/81 126/79  Pulse: 64 82  Resp: 18 18  Temp: 36.9 C 37.2 C  SpO2: 100% 96%    Last Pain:  Vitals:   07/25/21 0726  TempSrc: Oral  PainSc:                  [place]

## 2021-07-28 ENCOUNTER — Encounter (HOSPITAL_COMMUNITY): Payer: Self-pay | Admitting: Neurosurgery

## 2021-08-16 ENCOUNTER — Other Ambulatory Visit (HOSPITAL_COMMUNITY): Payer: Self-pay

## 2021-08-16 MED ORDER — AMPHETAMINE-DEXTROAMPHET ER 30 MG PO CP24
30.0000 mg | ORAL_CAPSULE | Freq: Every morning | ORAL | 0 refills | Status: DC
  Filled 2021-08-16 – 2021-08-18 (×2): qty 30, 30d supply, fill #0

## 2021-08-17 ENCOUNTER — Other Ambulatory Visit (HOSPITAL_COMMUNITY): Payer: Self-pay

## 2021-08-18 ENCOUNTER — Other Ambulatory Visit (HOSPITAL_COMMUNITY): Payer: Self-pay

## 2021-09-12 ENCOUNTER — Other Ambulatory Visit (HOSPITAL_COMMUNITY): Payer: Self-pay

## 2021-09-13 ENCOUNTER — Other Ambulatory Visit (HOSPITAL_COMMUNITY): Payer: Self-pay

## 2021-09-13 MED ORDER — AMPHETAMINE-DEXTROAMPHET ER 30 MG PO CP24
30.0000 mg | ORAL_CAPSULE | Freq: Every morning | ORAL | 0 refills | Status: DC
  Filled 2021-09-15: qty 30, 30d supply, fill #0

## 2021-09-14 ENCOUNTER — Other Ambulatory Visit (HOSPITAL_COMMUNITY): Payer: Self-pay

## 2021-09-15 ENCOUNTER — Other Ambulatory Visit (HOSPITAL_COMMUNITY): Payer: Self-pay

## 2021-10-16 ENCOUNTER — Other Ambulatory Visit (HOSPITAL_COMMUNITY): Payer: Self-pay

## 2021-10-17 ENCOUNTER — Other Ambulatory Visit (HOSPITAL_COMMUNITY): Payer: Self-pay

## 2021-10-17 MED ORDER — AMPHETAMINE-DEXTROAMPHET ER 30 MG PO CP24
30.0000 mg | ORAL_CAPSULE | Freq: Every morning | ORAL | 0 refills | Status: DC
  Filled 2021-10-17: qty 30, 30d supply, fill #0

## 2021-11-13 ENCOUNTER — Other Ambulatory Visit (HOSPITAL_COMMUNITY): Payer: Self-pay

## 2021-11-13 MED ORDER — AMPHETAMINE-DEXTROAMPHET ER 30 MG PO CP24
30.0000 mg | ORAL_CAPSULE | Freq: Every morning | ORAL | 0 refills | Status: DC
  Filled 2021-11-13: qty 30, 30d supply, fill #0

## 2021-12-12 ENCOUNTER — Other Ambulatory Visit (HOSPITAL_COMMUNITY): Payer: Self-pay

## 2021-12-13 ENCOUNTER — Other Ambulatory Visit (HOSPITAL_COMMUNITY): Payer: Self-pay

## 2021-12-13 MED ORDER — AMPHETAMINE-DEXTROAMPHET ER 30 MG PO CP24
30.0000 mg | ORAL_CAPSULE | Freq: Every morning | ORAL | 0 refills | Status: DC
  Filled 2021-12-13: qty 30, 30d supply, fill #0

## 2022-01-10 ENCOUNTER — Other Ambulatory Visit (HOSPITAL_COMMUNITY): Payer: Self-pay

## 2022-01-11 ENCOUNTER — Other Ambulatory Visit (HOSPITAL_COMMUNITY): Payer: Self-pay

## 2022-01-11 MED ORDER — AMPHETAMINE-DEXTROAMPHET ER 30 MG PO CP24
30.0000 mg | ORAL_CAPSULE | Freq: Every morning | ORAL | 0 refills | Status: DC
  Filled 2022-01-11: qty 30, 30d supply, fill #0

## 2022-02-05 ENCOUNTER — Other Ambulatory Visit (HOSPITAL_COMMUNITY): Payer: Self-pay

## 2022-02-06 ENCOUNTER — Other Ambulatory Visit (HOSPITAL_COMMUNITY): Payer: Self-pay

## 2022-02-06 MED ORDER — AMPHETAMINE-DEXTROAMPHET ER 30 MG PO CP24
30.0000 mg | ORAL_CAPSULE | Freq: Every morning | ORAL | 0 refills | Status: DC
  Filled 2022-02-06: qty 30, 30d supply, fill #0

## 2022-02-20 ENCOUNTER — Other Ambulatory Visit: Payer: Self-pay | Admitting: Obstetrics and Gynecology

## 2022-02-20 DIAGNOSIS — Z1231 Encounter for screening mammogram for malignant neoplasm of breast: Secondary | ICD-10-CM

## 2022-03-06 ENCOUNTER — Other Ambulatory Visit (HOSPITAL_COMMUNITY): Payer: Self-pay

## 2022-03-06 MED ORDER — AMPHETAMINE-DEXTROAMPHET ER 30 MG PO CP24
30.0000 mg | ORAL_CAPSULE | Freq: Every morning | ORAL | 0 refills | Status: DC
  Filled 2022-03-06: qty 30, 30d supply, fill #0

## 2022-04-06 ENCOUNTER — Other Ambulatory Visit (HOSPITAL_COMMUNITY): Payer: Self-pay

## 2022-04-09 ENCOUNTER — Other Ambulatory Visit (HOSPITAL_COMMUNITY): Payer: Self-pay

## 2022-04-09 MED ORDER — AMPHETAMINE-DEXTROAMPHET ER 30 MG PO CP24
30.0000 mg | ORAL_CAPSULE | Freq: Every morning | ORAL | 0 refills | Status: DC
  Filled 2022-04-09 (×2): qty 30, 30d supply, fill #0

## 2022-04-09 MED ORDER — AMPHETAMINE-DEXTROAMPHET ER 30 MG PO CP24
30.0000 mg | ORAL_CAPSULE | Freq: Every morning | ORAL | 0 refills | Status: DC
  Filled 2022-04-09 – 2022-06-07 (×3): qty 30, 30d supply, fill #0

## 2022-04-10 ENCOUNTER — Other Ambulatory Visit (HOSPITAL_COMMUNITY): Payer: Self-pay

## 2022-04-10 MED ORDER — AMPHETAMINE-DEXTROAMPHET ER 30 MG PO CP24
30.0000 mg | ORAL_CAPSULE | Freq: Every morning | ORAL | 0 refills | Status: DC
  Filled 2022-05-06: qty 30, 30d supply, fill #0

## 2022-04-16 ENCOUNTER — Other Ambulatory Visit (HOSPITAL_COMMUNITY): Payer: Self-pay

## 2022-04-17 ENCOUNTER — Other Ambulatory Visit (HOSPITAL_COMMUNITY): Payer: Self-pay

## 2022-05-06 ENCOUNTER — Other Ambulatory Visit: Payer: Self-pay

## 2022-05-07 ENCOUNTER — Other Ambulatory Visit (HOSPITAL_COMMUNITY): Payer: Self-pay

## 2022-05-23 ENCOUNTER — Ambulatory Visit
Admission: RE | Admit: 2022-05-23 | Discharge: 2022-05-23 | Disposition: A | Discharge status: Home / Self Care | Payer: BC Managed Care – PPO | Source: Ambulatory Visit | Attending: Obstetrics and Gynecology | Admitting: Obstetrics and Gynecology

## 2022-05-23 DIAGNOSIS — Z1231 Encounter for screening mammogram for malignant neoplasm of breast: Secondary | ICD-10-CM

## 2022-06-07 ENCOUNTER — Other Ambulatory Visit (HOSPITAL_COMMUNITY): Payer: Self-pay

## 2022-06-19 ENCOUNTER — Ambulatory Visit
Admission: RE | Admit: 2022-06-19 | Discharge: 2022-06-19 | Disposition: A | Discharge status: Home / Self Care | Payer: BC Managed Care – PPO | Source: Ambulatory Visit | Attending: Obstetrics and Gynecology | Admitting: Obstetrics and Gynecology

## 2022-06-19 ENCOUNTER — Other Ambulatory Visit: Payer: Self-pay | Admitting: Obstetrics and Gynecology

## 2022-06-19 DIAGNOSIS — R053 Chronic cough: Secondary | ICD-10-CM

## 2022-07-02 ENCOUNTER — Other Ambulatory Visit (HOSPITAL_COMMUNITY): Payer: Self-pay

## 2022-07-04 ENCOUNTER — Other Ambulatory Visit (HOSPITAL_COMMUNITY): Payer: Self-pay

## 2022-07-04 MED ORDER — AMPHETAMINE-DEXTROAMPHET ER 30 MG PO CP24
30.0000 mg | ORAL_CAPSULE | Freq: Every morning | ORAL | 0 refills | Status: DC
  Filled 2022-07-04: qty 30, 30d supply, fill #0

## 2022-07-31 ENCOUNTER — Other Ambulatory Visit (HOSPITAL_COMMUNITY): Payer: Self-pay

## 2022-08-01 ENCOUNTER — Other Ambulatory Visit (HOSPITAL_COMMUNITY): Payer: Self-pay

## 2022-08-01 MED ORDER — AMPHETAMINE-DEXTROAMPHET ER 30 MG PO CP24
30.0000 mg | ORAL_CAPSULE | Freq: Every morning | ORAL | 0 refills | Status: DC
  Filled 2022-08-01: qty 30, 30d supply, fill #0

## 2022-08-27 ENCOUNTER — Other Ambulatory Visit (HOSPITAL_COMMUNITY): Payer: Self-pay

## 2022-08-28 ENCOUNTER — Other Ambulatory Visit (HOSPITAL_COMMUNITY): Payer: Self-pay

## 2022-08-28 MED ORDER — AMPHETAMINE-DEXTROAMPHET ER 30 MG PO CP24
30.0000 mg | ORAL_CAPSULE | Freq: Every morning | ORAL | 0 refills | Status: DC
  Filled 2022-08-28: qty 3, 3d supply, fill #0
  Filled 2022-08-28: qty 30, 30d supply, fill #0
  Filled 2022-08-28: qty 27, 27d supply, fill #0

## 2022-10-02 ENCOUNTER — Other Ambulatory Visit (HOSPITAL_COMMUNITY): Payer: Self-pay

## 2022-10-02 MED ORDER — AMPHETAMINE-DEXTROAMPHET ER 30 MG PO CP24
30.0000 mg | ORAL_CAPSULE | Freq: Every morning | ORAL | 0 refills | Status: DC
  Filled 2022-10-02: qty 30, 30d supply, fill #0

## 2022-10-03 ENCOUNTER — Other Ambulatory Visit (HOSPITAL_COMMUNITY): Payer: Self-pay

## 2022-11-02 ENCOUNTER — Other Ambulatory Visit (HOSPITAL_COMMUNITY): Payer: Self-pay

## 2022-11-03 ENCOUNTER — Other Ambulatory Visit (HOSPITAL_COMMUNITY): Payer: Self-pay

## 2022-11-03 MED ORDER — AMPHETAMINE-DEXTROAMPHET ER 30 MG PO CP24
30.0000 mg | ORAL_CAPSULE | Freq: Every morning | ORAL | 0 refills | Status: AC
  Filled 2022-11-03: qty 30, 30d supply, fill #0

## 2022-11-05 ENCOUNTER — Other Ambulatory Visit (HOSPITAL_COMMUNITY): Payer: Self-pay

## 2022-12-03 ENCOUNTER — Other Ambulatory Visit (HOSPITAL_COMMUNITY): Payer: Self-pay

## 2022-12-03 MED ORDER — AMPHETAMINE-DEXTROAMPHET ER 30 MG PO CP24
30.0000 mg | ORAL_CAPSULE | Freq: Every morning | ORAL | 0 refills | Status: DC
  Filled 2022-12-03: qty 30, 30d supply, fill #0

## 2022-12-04 ENCOUNTER — Other Ambulatory Visit (HOSPITAL_COMMUNITY): Payer: Self-pay

## 2022-12-05 ENCOUNTER — Other Ambulatory Visit (HOSPITAL_COMMUNITY): Payer: Self-pay

## 2023-01-01 ENCOUNTER — Other Ambulatory Visit (HOSPITAL_COMMUNITY): Payer: Self-pay

## 2023-01-03 ENCOUNTER — Other Ambulatory Visit (HOSPITAL_COMMUNITY): Payer: Self-pay

## 2023-01-03 MED ORDER — AMPHETAMINE-DEXTROAMPHET ER 30 MG PO CP24
30.0000 mg | ORAL_CAPSULE | Freq: Every morning | ORAL | 0 refills | Status: DC
  Filled 2023-01-03: qty 30, 30d supply, fill #0

## 2023-01-31 ENCOUNTER — Encounter (HOSPITAL_COMMUNITY): Payer: Self-pay

## 2023-01-31 ENCOUNTER — Other Ambulatory Visit (HOSPITAL_COMMUNITY): Payer: Self-pay

## 2023-01-31 MED ORDER — ADDERALL XR 30 MG PO CP24
30.0000 mg | ORAL_CAPSULE | Freq: Every morning | ORAL | 0 refills | Status: DC
  Filled 2023-01-31: qty 90, 90d supply, fill #0

## 2023-02-01 ENCOUNTER — Other Ambulatory Visit (HOSPITAL_COMMUNITY): Payer: Self-pay

## 2023-04-30 ENCOUNTER — Other Ambulatory Visit: Payer: Self-pay | Admitting: Obstetrics and Gynecology

## 2023-04-30 DIAGNOSIS — Z0000 Encounter for general adult medical examination without abnormal findings: Secondary | ICD-10-CM

## 2023-05-01 ENCOUNTER — Other Ambulatory Visit: Payer: Self-pay

## 2023-05-01 ENCOUNTER — Other Ambulatory Visit (HOSPITAL_COMMUNITY): Payer: Self-pay

## 2023-05-01 MED ORDER — ADDERALL XR 30 MG PO CP24
30.0000 mg | ORAL_CAPSULE | Freq: Every morning | ORAL | 0 refills | Status: AC
  Filled 2023-05-01: qty 90, 90d supply, fill #0

## 2023-05-02 ENCOUNTER — Other Ambulatory Visit (HOSPITAL_COMMUNITY): Payer: Self-pay

## 2023-06-10 ENCOUNTER — Ambulatory Visit
Admission: RE | Admit: 2023-06-10 | Discharge: 2023-06-10 | Disposition: A | Discharge status: Home / Self Care | Source: Ambulatory Visit | Attending: Obstetrics and Gynecology | Admitting: Obstetrics and Gynecology

## 2023-06-10 DIAGNOSIS — Z0000 Encounter for general adult medical examination without abnormal findings: Secondary | ICD-10-CM

## 2023-06-13 ENCOUNTER — Other Ambulatory Visit: Payer: Self-pay | Admitting: Obstetrics and Gynecology

## 2023-06-13 ENCOUNTER — Ambulatory Visit: Admitting: Physical Therapy

## 2023-06-13 DIAGNOSIS — R928 Other abnormal and inconclusive findings on diagnostic imaging of breast: Secondary | ICD-10-CM

## 2023-06-20 ENCOUNTER — Encounter (HOSPITAL_COMMUNITY): Payer: Self-pay

## 2023-06-27 ENCOUNTER — Ambulatory Visit
Admission: RE | Admit: 2023-06-27 | Discharge: 2023-06-27 | Disposition: A | Discharge status: Home / Self Care | Source: Ambulatory Visit | Attending: Obstetrics and Gynecology | Admitting: Obstetrics and Gynecology

## 2023-06-27 DIAGNOSIS — R928 Other abnormal and inconclusive findings on diagnostic imaging of breast: Secondary | ICD-10-CM

## 2023-07-05 ENCOUNTER — Encounter

## 2023-07-05 ENCOUNTER — Other Ambulatory Visit

## 2023-08-06 ENCOUNTER — Other Ambulatory Visit (HOSPITAL_COMMUNITY): Payer: Self-pay

## 2023-08-06 MED ORDER — ADDERALL XR 30 MG PO CP24
30.0000 mg | ORAL_CAPSULE | Freq: Every morning | ORAL | 0 refills | Status: DC
  Filled 2023-08-06: qty 90, 90d supply, fill #0

## 2023-08-07 ENCOUNTER — Other Ambulatory Visit (HOSPITAL_COMMUNITY): Payer: Self-pay

## 2023-08-14 ENCOUNTER — Other Ambulatory Visit (HOSPITAL_COMMUNITY): Payer: Self-pay

## 2023-11-05 ENCOUNTER — Other Ambulatory Visit (HOSPITAL_COMMUNITY): Payer: Self-pay

## 2023-11-05 MED ORDER — ADDERALL XR 30 MG PO CP24
30.0000 mg | ORAL_CAPSULE | Freq: Every morning | ORAL | 0 refills | Status: DC
  Filled 2023-11-05: qty 90, 90d supply, fill #0

## 2023-12-24 NOTE — Therapy (Unsigned)
 OUTPATIENT PHYSICAL THERAPY FEMALE PELVIC EVALUATION   Patient Name: Sara Werner MRN: [id number] DOB:08/27/1960, 63 y.o., female Today's Date: 12/25/2023  END OF SESSION:  PT End of Session - 12/25/23 1154     Visit Number 1    Date for Recertification  06/23/24    Authorization Type BCBS    PT Start Time 1145    PT Stop Time 1225    PT Time Calculation (min) 40 min    Activity Tolerance Patient tolerated treatment well    Behavior During Therapy WFL for tasks assessed/performed          Past Medical History:  Diagnosis Date   Cancer (HCC)    Complication of anesthesia    Family history of breast cancer    Hypertension    PONV (postoperative nausea and vomiting)    Past Surgical History:  Procedure Laterality Date   ANTERIOR CERVICAL DECOMP/DISCECTOMY FUSION N/A 07/24/2021   Procedure: ANTERIOR CERVICAL DECOMPRESSION/DISCECTOMY FUSION CERVICAL FOUR-FIVE, CERVICAL FIVE-SIX, CERVICAL SIX-SEVEN;  Surgeon: Louis Shove, MD;  Location: [facility] OR;  Service: Neurosurgery;  Laterality: N/A;   bladder tack     BREAST BIOPSY Right    negative   HERNIA REPAIR     ROBOTIC ASSISTED TOTAL HYSTERECTOMY WITH BILATERAL SALPINGO OOPHERECTOMY Bilateral 05/08/2018   Procedure: XI ROBOTIC ASSISTED TOTAL HYSTERECTOMY BILATERAL SALPINGO-OOPHORECTOMY,OMENTECTOMY WITH STAGING;  Surgeon: Eloy Herring, MD;  Location: [facility];  Service: Gynecology;  Laterality: Bilateral;   ROTATOR CUFF REPAIR Right    12-19   Patient Active Problem List   Diagnosis Date Noted   Cervical spondylosis with myelopathy and radiculopathy 07/22/2021   Abdominal wall hernia 11/10/2019   Genetic testing 08/04/2018   Family history of breast cancer    Left ovarian epithelial cancer (HCC)    Ovarian mass, left 05/02/2018    PCP: Sophronia Ozell BROCKS, MD  REFERRING PROVIDER: Mat Browning, MD   REFERRING DIAG: N81.9 (ICD-10-CM) - Female genital prolapse, unspecified   THERAPY DIAG:  Muscle weakness  (generalized)  Other lack of coordination  Rationale for Evaluation and Treatment: Rehabilitation  ONSET DATE: 04/2023  SUBJECTIVE:                                                                                                                                                                                           SUBJECTIVE STATEMENT: MD wants her to do the physical therapy for the pelvic floor strength. Patient had surgery with bladder tack.    PERTINENT HISTORY:  Medications for current condition: none Surgeries: Anterior Cervical  Decompression; Robotic Assisted total hysterectomy with bilateral salpingo Oophorectomy;  S/p hernia repair Other: Adenocarcinoma  of ovary  Sexual abuse: No  PAIN:  Are you having pain? No  PRECAUTIONS: Other: cancer  RED FLAGS: None   WEIGHT BEARING RESTRICTIONS: No  FALLS:  Has patient fallen in last 6 months? 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; catering manager, treadmill  PLOF: Independent  PATIENT GOALS: strengthen the pelvic floor   BOWEL MOVEMENT: Pain with bowel movement: No Type of bowel movement:Type (Bristol Stool Scale) Type 4, Frequency daily, and Strain no Fully empty rectum: Yes:   Leakage: No                                                 Bowel urgency: only when eat peppers Pads: No Fiber supplement/laxative No  URINATION: Pain with urination: No Fully empty bladder: Yes:                                           Post-void dribble: No Stream: Strong Urgency: No Frequency:during the day average                                                        Nocturia: No1   Leakage: none Pads/briefs: No  INTERCOURSE:   Ability to have vaginal penetration Yes  Pain with intercourse: none  PREGNANCY: Vaginal deliveries 2 Tearing Yes: a little  PROLAPSE: Bulge   OBJECTIVE:  Note: Objective measures were completed at Evaluation unless otherwise noted.    PATIENT SURVEYS:  PFIQ-7:  33 POPIQ-7 33   COGNITION: Overall cognitive status: Within functional limits for tasks assessed     SENSATION: Light touch: Appears intact    FUNCTIONAL TESTS:  Single leg stance:  Rt: equal pelvis  Ou:zvljo pelvis Squat:flexed spine  GAIT: Assistive device utilized: None  POSTURE: rounded shoulders, forward head, and decreased lumbar lordosis   LUMBARAROM/PROM:Lumbar ROM is full    LOWER EXTREMITY ROM:  Passive ROM Right eval Left eval  Hip external rotation 40 60   (Blank rows = not tested)  LOWER EXTREMITY MMT:  MMT Right eval Left eval  Hip extension 4/5 4/5  Hip abduction 4/5 3+/5   (Blank rows = not tested) PALPATION: Pelvic Alignment: ASIS are equal  Abdominal:   Diastasis: No Distortion: No  Breathing: breaths more into the upper abdomen Scar tissue: No                 External Perineal Exam: intact and good coloring                             Internal Pelvic Floor: tightness along the sides of the introitus  Patient confirms identification and approves PT to assess internal pelvic floor and treatment Yes All internal or external pelvic floor assessments and/or treatments are completed with proper hand hygiene and gloves hands. If needed gloves are changed with hand hygiene during patient care time. No emotional/communication barriers or cognitive limitation. Patient is motivated to learn. Patient understands and agrees with treatment goals and plan. PT explains patient  will be examined in standing, sitting, and lying down to see how their muscles and joints work. When they are ready, they will be asked to remove their underwear so PT can examine their perineum. The patient is also given the option of providing their own chaperone as one is not provided in our facility. The patient also has the right and is explained the right to defer or refuse any part of the evaluation or treatment including the internal exam. With the patient's consent, PT  will use one gloved finger to gently assess the muscles of the pelvic floor, seeing how well it contracts and relaxes and if there is muscle symmetry. After, the patient will get dressed and PT and patient will discuss exam findings and plan of care. PT and patient discuss plan of care, schedule, attendance policy and HEP activities.   PELVIC MMT:   MMT eval  Vaginal 3/5  (Blank rows = not tested)        TONE: Good tone   TODAY'S TREATMENT:                                                                                                                              DATE: 12/25/23  EVAL Examination completed, findings reviewed, pt educated on POC, HEP, and female pelvic floor anatomy, reasoning with pelvic floor assessment internally with pt consent. Pt motivated to participate in PT and agreeable to attempt recommendations.     PATIENT EDUCATION:  12/25/23 Education details: Access Code: [email or web address] Person educated: Patient Education method: Programmer, Multimedia, Demonstration, Actor cues, Verbal cues, and Handouts Education comprehension: verbalized understanding, returned demonstration, verbal cues required, tactile cues required, and needs further education  HOME EXERCISE PROGRAM: 12/25/23 Access Code: [email or web address] URL: [email or web address] Date: 12/25/2023 Prepared by: Channing Pereyra  Exercises - Seated Pelvic Floor Contraction  - 3 x daily - 7 x weekly - 1 sets - 10 reps - 10 sec hold  ASSESSMENT:  CLINICAL IMPRESSION: Patient is a 63 y.o. female who was seen today for physical therapy evaluation and treatment for female genital prolapse. Patient has had a cystocele repair 06/2023. Patient still feels a slight bulge at times. Pelvic floor strength is 3/5 with weak hug of therapist finger and tightness along the sides of the introitus. She will bulge the upper abdominal instead of the whole abdomen when trying to perform diaphragmatic breathing. She has difficulty with fully  contraction her abdomen. She has weakness in her hips. Patient likes to exercise and would like to know the correct way to exercise to prevent further pelvic floor issues. Patient would benefit from skilled therapy to improve pelvic floor strength and support to reduce strain on pelvic floor and the bladder.   OBJECTIVE IMPAIRMENTS: decreased activity tolerance, decreased coordination, and decreased strength.   ACTIVITY LIMITATIONS: carrying, lifting, standing, squatting, and exercise  PARTICIPATION LIMITATIONS: cleaning and laundry  PERSONAL FACTORS: 3+ comorbidities: Anterior Cervical  Decompression; Robotic Assisted total hysterectomy with bilateral salpingo  Oophorectomy;  S/p hernia repair are also affecting patient's functional outcome.   REHAB POTENTIAL: Excellent  CLINICAL DECISION MAKING: Evolving/moderate complexity  EVALUATION COMPLEXITY: Moderate   GOALS: Goals reviewed with patient? Yes  SHORT TERM GOALS: Target date: 01/22/24  Patient educated on pressure management with daily activities to reduce strain on the pelvic floor.  Baseline: Goal status: INITIAL  2.  Patient is able to perform diaphragmatic breathing with expansion of the full abdomen to relax the pelvic floor.  Baseline:  Goal status: INITIAL  3.  Patient understands how to lift items with keeping the distance between the rib cage and pubic bone to reduce strain on the pelvic floor.  Baseline:  Goal status: INITIAL   LONG TERM GOALS: Target date: 06/23/24  Patient is independent with advanced HEP for core and pelvic floor strength to reduce the feeling of her bulge 50% of the day.  Baseline:  Goal status: INITIAL  2.  Patient is able to work out with correct body mechanics to not place pressure on the pelvic floor.  Baseline:  Goal status: INITIAL  3.  Patient is able to reports her pelvic floor strength is increased due to her not feeling the bulge 50% of the time.  Baseline:  Goal status:  INITIAL  4.  Patient understands how to lift items with pelvic floor and core engagement to reduce the feeling of the bulge.  Baseline:  Goal status: INITIAL  5.  Patient is able to do her rowing machine with reduction of pressure management so she does not feel the pelvic floor bulge.  Baseline:  Goal status: INITIAL   PLAN:  PT FREQUENCY: 1x/week  PT DURATION: 6 months  PLANNED INTERVENTIONS: 97110-Therapeutic exercises, 97530- Therapeutic activity, 97112- Neuromuscular re-education, 97535- Self Care, [id number]- Manual therapy, [id number]- Electrical stimulation (unattended), Patient/Family education, Joint mobilization, Spinal mobilization, Cryotherapy, Moist heat, and Biofeedback  PLAN FOR NEXT SESSION: pressure management education, core strength, diaphragmatic breathing, prolapse managment   Channing Pereyra, PT 12/25/23 5:04 PM

## 2023-12-25 ENCOUNTER — Ambulatory Visit: Attending: Obstetrics and Gynecology | Admitting: Physical Therapy

## 2023-12-25 ENCOUNTER — Other Ambulatory Visit: Payer: Self-pay

## 2023-12-25 ENCOUNTER — Encounter: Payer: Self-pay | Admitting: Physical Therapy

## 2023-12-25 DIAGNOSIS — R278 Other lack of coordination: Secondary | ICD-10-CM | POA: Insufficient documentation

## 2023-12-25 DIAGNOSIS — M6281 Muscle weakness (generalized): Secondary | ICD-10-CM | POA: Insufficient documentation

## 2024-01-01 ENCOUNTER — Ambulatory Visit

## 2024-01-01 DIAGNOSIS — M6281 Muscle weakness (generalized): Secondary | ICD-10-CM

## 2024-01-01 DIAGNOSIS — R278 Other lack of coordination: Secondary | ICD-10-CM

## 2024-01-01 NOTE — Patient Instructions (Signed)
   The first picture shows that there is no effect on the pelvic floor with gravity eliminated. The next three show that with a wedge pillow or a few pillows from home under your pelvis the pelvic floor is inverted and may relax and allows gravity to help return prolapsed areas more inward to help relieve symptoms. Do this 15-20 mins every evening when symptoms tend to be worse. Stop if you have pain or negative symptoms.

## 2024-01-01 NOTE — Therapy (Signed)
 OUTPATIENT PHYSICAL THERAPY FEMALE PELVIC EVALUATION   Patient Name: Sara Werner MRN: [id number] DOB:03/17/1960, 63 y.o., female Today's Date: 01/01/2024  END OF SESSION:  PT End of Session - 01/01/24 1148     Visit Number 2    Date for Recertification  06/23/24    Authorization Type [employer]    PT Start Time 1147    PT Stop Time 1225    PT Time Calculation (min) 38 min    Activity Tolerance Patient tolerated treatment well    Behavior During Therapy WFL for tasks assessed/performed           Past Medical History:  Diagnosis Date   Cancer (HCC)    Complication of anesthesia    Family history of breast cancer    Hypertension    PONV (postoperative nausea and vomiting)    Past Surgical History:  Procedure Laterality Date   ANTERIOR CERVICAL DECOMP/DISCECTOMY FUSION N/A 07/24/2021   Procedure: ANTERIOR CERVICAL DECOMPRESSION/DISCECTOMY FUSION CERVICAL FOUR-FIVE, CERVICAL FIVE-SIX, CERVICAL SIX-SEVEN;  Surgeon: Louis Shove, MD;  Location: [facility];  Service: Neurosurgery;  Laterality: N/A;   bladder tack     BREAST BIOPSY Right    negative   HERNIA REPAIR     ROBOTIC ASSISTED TOTAL HYSTERECTOMY WITH BILATERAL SALPINGO OOPHERECTOMY Bilateral 05/08/2018   Procedure: XI ROBOTIC ASSISTED TOTAL HYSTERECTOMY BILATERAL SALPINGO-OOPHORECTOMY,OMENTECTOMY WITH STAGING;  Surgeon: Eloy Herring, MD;  Location: [facility];  Service: Gynecology;  Laterality: Bilateral;   ROTATOR CUFF REPAIR Right    12-19   Patient Active Problem List   Diagnosis Date Noted   Cervical spondylosis with myelopathy and radiculopathy 07/22/2021   Abdominal wall hernia 11/10/2019   Genetic testing 08/04/2018   Family history of breast cancer    Left ovarian epithelial cancer (HCC)    Ovarian mass, left 05/02/2018    PCP: Sophronia Ozell BROCKS, MD  REFERRING PROVIDER: Mat Browning, MD   REFERRING DIAG: N81.9 (ICD-10-CM) - Female genital prolapse, unspecified   THERAPY DIAG:  Muscle weakness  (generalized)  Other lack of coordination  Rationale for Evaluation and Treatment: Rehabilitation  ONSET DATE: 04/2023  SUBJECTIVE:                                                                                                                                                                                           SUBJECTIVE STATEMENT: Patient is working on pelvic floor muscle contractions, but feels like they are difficult.    PERTINENT HISTORY:  Medications for current condition: none Surgeries: Anterior Cervical  Decompression; Robotic Assisted total hysterectomy with bilateral salpingo Oophorectomy;  S/p hernia repair Other: Adenocarcinoma of ovary  Sexual  abuse: No  PAIN:  Are you having pain? No  PRECAUTIONS: Other: cancer  RED FLAGS: None   WEIGHT BEARING RESTRICTIONS: No  FALLS:  Has patient fallen in last 6 months? 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; catering manager, treadmill  PLOF: Independent  PATIENT GOALS: strengthen the pelvic floor   BOWEL MOVEMENT: Pain with bowel movement: No Type of bowel movement:Type (Bristol Stool Scale) Type 4, Frequency daily, and Strain no Fully empty rectum: Yes:   Leakage: No                                                 Bowel urgency: only when eat peppers Pads: No Fiber supplement/laxative No  URINATION: Pain with urination: No Fully empty bladder: Yes:                                           Post-void dribble: No Stream: Strong Urgency: No Frequency:during the day average                                                        Nocturia: No1   Leakage: none Pads/briefs: No  INTERCOURSE:   Ability to have vaginal penetration Yes  Pain with intercourse: none  PREGNANCY: Vaginal deliveries 2 Tearing Yes: a little  PROLAPSE: Bulge   OBJECTIVE:  Note: Objective measures were completed at Evaluation unless otherwise noted.    PATIENT SURVEYS:  PFIQ-7: 33 POPIQ-7  33   COGNITION: Overall cognitive status: Within functional limits for tasks assessed     SENSATION: Light touch: Appears intact    FUNCTIONAL TESTS:  Single leg stance:  Rt: equal pelvis  Ou:zvljo pelvis Squat:flexed spine  GAIT: Assistive device utilized: None  POSTURE: rounded shoulders, forward head, and decreased lumbar lordosis   LUMBARAROM/PROM:Lumbar ROM is full    LOWER EXTREMITY ROM:  Passive ROM Right eval Left eval  Hip external rotation 40 60   (Blank rows = not tested)  LOWER EXTREMITY MMT:  MMT Right eval Left eval  Hip extension 4/5 4/5  Hip abduction 4/5 3+/5   (Blank rows = not tested) PALPATION: Pelvic Alignment: ASIS are equal  Abdominal:   Diastasis: No Distortion: No  Breathing: breaths more into the upper abdomen Scar tissue: No                 External Perineal Exam: intact and good coloring                             Internal Pelvic Floor: tightness along the sides of the introitus  Patient confirms identification and approves PT to assess internal pelvic floor and treatment Yes All internal or external pelvic floor assessments and/or treatments are completed with proper hand hygiene and gloves hands. If needed gloves are changed with hand hygiene during patient care time. No emotional/communication barriers or cognitive limitation. Patient is motivated to learn. Patient understands and agrees with treatment goals and plan. PT explains patient will be examined in  standing, sitting, and lying down to see how their muscles and joints work. When they are ready, they will be asked to remove their underwear so PT can examine their perineum. The patient is also given the option of providing their own chaperone as one is not provided in our facility. The patient also has the right and is explained the right to defer or refuse any part of the evaluation or treatment including the internal exam. With the patient's consent, PT will use one  gloved finger to gently assess the muscles of the pelvic floor, seeing how well it contracts and relaxes and if there is muscle symmetry. After, the patient will get dressed and PT and patient will discuss exam findings and plan of care. PT and patient discuss plan of care, schedule, attendance policy and HEP activities.   PELVIC MMT:   MMT eval  Vaginal 3/5  (Blank rows = not tested)        TONE: Good tone   TODAY'S TREATMENT:                                                                                                                              DATE:  01/01/24 Neuromuscular re-education: Transversus abdominus training with multimodal cues for improved motor control and breath coordination Transversus abdominus isometrics in supine 10x with manual cues on bil abdominal wall Supine hip adduction ball press with transversus abdominus and pelvic floor muscle contractions and breath coordination 10x Bridge with hip adduction, transversus abdominus, and pelvic floor muscle 2 x 10 Supine diagonal resisted hip flexion 10x bil Modified dead bug 10x bil Therapeutic activities: Pt education on inverted lying position - perform when she has symptoms of vaginal pressure and/or at the end of the day   12/25/23  EVAL Examination completed, findings reviewed, pt educated on POC, HEP, and female pelvic floor anatomy, reasoning with pelvic floor assessment internally with pt consent. Pt motivated to participate in PT and agreeable to attempt recommendations.     PATIENT EDUCATION:  12/25/23 Education details: Access Code: [email or web address] Person educated: Patient Education method: Programmer, Multimedia, Demonstration, Actor cues, Verbal cues, and Handouts Education comprehension: verbalized understanding, returned demonstration, verbal cues required, tactile cues required, and needs further education  HOME EXERCISE PROGRAM: 12/25/23 Access Code: [email or web address] URL: [email or web address] Date:  12/25/2023 Prepared by: Channing Pereyra  Exercises - Seated Pelvic Floor Contraction  - 3 x daily - 7 x weekly - 1 sets - 10 reps - 10 sec hold  ASSESSMENT:  CLINICAL IMPRESSION: Patient is a 63 y.o. female who was seen today for physical therapy  treatment for female genital prolapse. Pt reports some difficulty with coordination of pelvic floor muscle contractions at home, but has been working on them. We started performing core training today and she has tendency to bear down and brace abdominals instead of drawing in to more effectively manage pressure. This may be what is causing ongoing symptoms. She was able to correct contraction  with appropriate breathing and progress exercises to provide increased challenge. HEP updated Patient would benefit from skilled therapy to improve pelvic floor strength and support to reduce strain on pelvic floor and the bladder.   OBJECTIVE IMPAIRMENTS: decreased activity tolerance, decreased coordination, and decreased strength.   ACTIVITY LIMITATIONS: carrying, lifting, standing, squatting, and exercise  PARTICIPATION LIMITATIONS: cleaning and laundry  PERSONAL FACTORS: 3+ comorbidities: Anterior Cervical  Decompression; Robotic Assisted total hysterectomy with bilateral salpingo Oophorectomy;  S/p hernia repair are also affecting patient's functional outcome.   REHAB POTENTIAL: Excellent  CLINICAL DECISION MAKING: Evolving/moderate complexity  EVALUATION COMPLEXITY: Moderate   GOALS: Goals reviewed with patient? Yes  SHORT TERM GOALS: Target date: 01/22/24  Patient educated on pressure management with daily activities to reduce strain on the pelvic floor.  Baseline: Goal status: Met 01/01/24  2.  Patient is able to perform diaphragmatic breathing with expansion of the full abdomen to relax the pelvic floor.  Baseline:  Goal status: MET 01/01/24  3.  Patient understands how to lift items with keeping the distance between the rib cage and pubic  bone to reduce strain on the pelvic floor.  Baseline:  Goal status: INITIAL   LONG TERM GOALS: Target date: 06/23/24  Patient is independent with advanced HEP for core and pelvic floor strength to reduce the feeling of her bulge 50% of the day.  Baseline:  Goal status: INITIAL  2.  Patient is able to work out with correct body mechanics to not place pressure on the pelvic floor.  Baseline:  Goal status: INITIAL  3.  Patient is able to reports her pelvic floor strength is increased due to her not feeling the bulge 50% of the time.  Baseline:  Goal status: INITIAL  4.  Patient understands how to lift items with pelvic floor and core engagement to reduce the feeling of the bulge.  Baseline:  Goal status: INITIAL  5.  Patient is able to do her rowing machine with reduction of pressure management so she does not feel the pelvic floor bulge.  Baseline:  Goal status: INITIAL   PLAN:  PT FREQUENCY: 1x/week  PT DURATION: 6 months  PLANNED INTERVENTIONS: 97110-Therapeutic exercises, 97530- Therapeutic activity, 97112- Neuromuscular re-education, 97535- Self Care, [id number]- Manual therapy, G0283- Electrical stimulation (unattended), Patient/Family education, Joint mobilization, Spinal mobilization, Cryotherapy, Moist heat, and Biofeedback  PLAN FOR NEXT SESSION: pressure management education, core strength, diaphragmatic breathing, prolapse managment   Josette Mares, PT, DPT11/19/2511:50 AM [facility] [address] [place], [place] [address] Phone # [phone] Fax [phone]

## 2024-01-15 ENCOUNTER — Ambulatory Visit

## 2024-01-15 NOTE — Therapy (Incomplete)
 OUTPATIENT PHYSICAL THERAPY FEMALE PELVIC EVALUATION   Patient Name: Sara Werner MRN: [id number] DOB:October 10, 1960, 63 y.o., female Today's Date: 01/15/2024  END OF SESSION:     Past Medical History:  Diagnosis Date   Cancer (HCC)    Complication of anesthesia    Family history of breast cancer    Hypertension    PONV (postoperative nausea and vomiting)    Past Surgical History:  Procedure Laterality Date   ANTERIOR CERVICAL DECOMP/DISCECTOMY FUSION N/A 07/24/2021   Procedure: ANTERIOR CERVICAL DECOMPRESSION/DISCECTOMY FUSION CERVICAL FOUR-FIVE, CERVICAL FIVE-SIX, CERVICAL SIX-SEVEN;  Surgeon: Louis Shove, MD;  Location: [facility];  Service: Neurosurgery;  Laterality: N/A;   bladder tack     BREAST BIOPSY Right    negative   HERNIA REPAIR     ROBOTIC ASSISTED TOTAL HYSTERECTOMY WITH BILATERAL SALPINGO OOPHERECTOMY Bilateral 05/08/2018   Procedure: XI ROBOTIC ASSISTED TOTAL HYSTERECTOMY BILATERAL SALPINGO-OOPHORECTOMY,OMENTECTOMY WITH STAGING;  Surgeon: Eloy Herring, MD;  Location: [facility];  Service: Gynecology;  Laterality: Bilateral;   ROTATOR CUFF REPAIR Right    12-19   Patient Active Problem List   Diagnosis Date Noted   Cervical spondylosis with myelopathy and radiculopathy 07/22/2021   Abdominal wall hernia 11/10/2019   Genetic testing 08/04/2018   Family history of breast cancer    Left ovarian epithelial cancer (HCC)    Ovarian mass, left 05/02/2018    PCP: Sophronia Ozell BROCKS, MD  REFERRING PROVIDER: Mat Browning, MD   REFERRING DIAG: N81.9 (ICD-10-CM) - Female genital prolapse, unspecified   THERAPY DIAG:  No diagnosis found.  Rationale for Evaluation and Treatment: Rehabilitation  ONSET DATE: 04/2023  SUBJECTIVE:                                                                                                                                                                                           SUBJECTIVE STATEMENT:    PERTINENT HISTORY:   Medications for current condition: none Surgeries: Anterior Cervical  Decompression; Robotic Assisted total hysterectomy with bilateral salpingo Oophorectomy;  S/p hernia repair Other: Adenocarcinoma of ovary  Sexual abuse: No  PAIN:  Are you having pain? No  PRECAUTIONS: Other: cancer  RED FLAGS: None   WEIGHT BEARING RESTRICTIONS: No  FALLS:  Has patient fallen in last 6 months? 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; catering manager, treadmill  PLOF: Independent  PATIENT GOALS: strengthen the pelvic floor   BOWEL MOVEMENT: Pain with bowel movement: No Type of bowel movement:Type (Bristol Stool Scale) Type 4, Frequency daily, and Strain no Fully empty rectum: Yes:   Leakage: No  Bowel urgency: only when eat peppers Pads: No Fiber supplement/laxative No  URINATION: Pain with urination: No Fully empty bladder: Yes:                                           Post-void dribble: No Stream: Strong Urgency: No Frequency:during the day average                                                        Nocturia: No1   Leakage: none Pads/briefs: No  INTERCOURSE:   Ability to have vaginal penetration Yes  Pain with intercourse: none  PREGNANCY: Vaginal deliveries 2 Tearing Yes: a little  PROLAPSE: Bulge   OBJECTIVE:  Note: Objective measures were completed at Evaluation unless otherwise noted.    PATIENT SURVEYS:  PFIQ-7: 33 POPIQ-7 33   COGNITION: Overall cognitive status: Within functional limits for tasks assessed     SENSATION: Light touch: Appears intact    FUNCTIONAL TESTS:  Single leg stance:  Rt: equal pelvis  Ou:zvljo pelvis Squat:flexed spine  GAIT: Assistive device utilized: None  POSTURE: rounded shoulders, forward head, and decreased lumbar lordosis   LUMBARAROM/PROM:Lumbar ROM is full    LOWER EXTREMITY ROM:  Passive ROM Right eval Left eval  Hip  external rotation 40 60   (Blank rows = not tested)  LOWER EXTREMITY MMT:  MMT Right eval Left eval  Hip extension 4/5 4/5  Hip abduction 4/5 3+/5   (Blank rows = not tested) PALPATION: Pelvic Alignment: ASIS are equal  Abdominal:   Diastasis: No Distortion: No  Breathing: breaths more into the upper abdomen Scar tissue: No                 External Perineal Exam: intact and good coloring                             Internal Pelvic Floor: tightness along the sides of the introitus  Patient confirms identification and approves PT to assess internal pelvic floor and treatment Yes All internal or external pelvic floor assessments and/or treatments are completed with proper hand hygiene and gloves hands. If needed gloves are changed with hand hygiene during patient care time. No emotional/communication barriers or cognitive limitation. Patient is motivated to learn. Patient understands and agrees with treatment goals and plan. PT explains patient will be examined in standing, sitting, and lying down to see how their muscles and joints work. When they are ready, they will be asked to remove their underwear so PT can examine their perineum. The patient is also given the option of providing their own chaperone as one is not provided in our facility. The patient also has the right and is explained the right to defer or refuse any part of the evaluation or treatment including the internal exam. With the patient's consent, PT will use one gloved finger to gently assess the muscles of the pelvic floor, seeing how well it contracts and relaxes and if there is muscle symmetry. After, the patient will get dressed and PT and patient will discuss exam findings and plan of care. PT and patient discuss plan of care, schedule, attendance policy and HEP  activities.   PELVIC MMT:   MMT eval  Vaginal 3/5  (Blank rows = not tested)        TONE: Good tone   TODAY'S TREATMENT:                                                                                                                               DATE:  01/15/24 Manual:  Neuromuscular re-education:  Exercises:  Therapeutic activities:    01/01/24 Neuromuscular re-education: Transversus abdominus training with multimodal cues for improved motor control and breath coordination Transversus abdominus isometrics in supine 10x with manual cues on bil abdominal wall Supine hip adduction ball press with transversus abdominus and pelvic floor muscle contractions and breath coordination 10x Bridge with hip adduction, transversus abdominus, and pelvic floor muscle 2 x 10 Supine diagonal resisted hip flexion 10x bil Modified dead bug 10x bil Therapeutic activities: Pt education on inverted lying position - perform when she has symptoms of vaginal pressure and/or at the end of the day   12/25/23  EVAL Examination completed, findings reviewed, pt educated on POC, HEP, and female pelvic floor anatomy, reasoning with pelvic floor assessment internally with pt consent. Pt motivated to participate in PT and agreeable to attempt recommendations.     PATIENT EDUCATION:  12/25/23 Education details: Access Code: [email or web address] Person educated: Patient Education method: Programmer, Multimedia, Demonstration, Actor cues, Verbal cues, and Handouts Education comprehension: verbalized understanding, returned demonstration, verbal cues required, tactile cues required, and needs further education  HOME EXERCISE PROGRAM: 12/25/23 Access Code: [email or web address] URL: [email or web address] Date: 12/25/2023 Prepared by: Channing Pereyra  Exercises - Seated Pelvic Floor Contraction  - 3 x daily - 7 x weekly - 1 sets - 10 reps - 10 sec hold  ASSESSMENT:  CLINICAL IMPRESSION: Patient is a 63 y.o. female who was seen today for physical therapy  treatment for female genital prolapse. Pt reports some difficulty with coordination of pelvic floor muscle contractions at  home, but has been working on them. We started performing core training today and she has tendency to bear down and brace abdominals instead of drawing in to more effectively manage pressure. This may be what is causing ongoing symptoms. She was able to correct contraction with appropriate breathing and progress exercises to provide increased challenge. HEP updated Patient would benefit from skilled therapy to improve pelvic floor strength and support to reduce strain on pelvic floor and the bladder.   OBJECTIVE IMPAIRMENTS: decreased activity tolerance, decreased coordination, and decreased strength.   ACTIVITY LIMITATIONS: carrying, lifting, standing, squatting, and exercise  PARTICIPATION LIMITATIONS: cleaning and laundry  PERSONAL FACTORS: 3+ comorbidities: Anterior Cervical  Decompression; Robotic Assisted total hysterectomy with bilateral salpingo Oophorectomy;  S/p hernia repair are also affecting patient's functional outcome.   REHAB POTENTIAL: Excellent  CLINICAL DECISION MAKING: Evolving/moderate complexity  EVALUATION COMPLEXITY: Moderate   GOALS: Goals reviewed with patient? Yes  SHORT TERM GOALS: Target date: 01/22/24  Patient educated on pressure management with  daily activities to reduce strain on the pelvic floor.  Baseline: Goal status: Met 01/01/24  2.  Patient is able to perform diaphragmatic breathing with expansion of the full abdomen to relax the pelvic floor.  Baseline:  Goal status: MET 01/01/24  3.  Patient understands how to lift items with keeping the distance between the rib cage and pubic bone to reduce strain on the pelvic floor.  Baseline:  Goal status: INITIAL   LONG TERM GOALS: Target date: 06/23/24  Patient is independent with advanced HEP for core and pelvic floor strength to reduce the feeling of her bulge 50% of the day.  Baseline:  Goal status: INITIAL  2.  Patient is able to work out with correct body mechanics to not place pressure on  the pelvic floor.  Baseline:  Goal status: INITIAL  3.  Patient is able to reports her pelvic floor strength is increased due to her not feeling the bulge 50% of the time.  Baseline:  Goal status: INITIAL  4.  Patient understands how to lift items with pelvic floor and core engagement to reduce the feeling of the bulge.  Baseline:  Goal status: INITIAL  5.  Patient is able to do her rowing machine with reduction of pressure management so she does not feel the pelvic floor bulge.  Baseline:  Goal status: INITIAL   PLAN:  PT FREQUENCY: 1x/week  PT DURATION: 6 months  PLANNED INTERVENTIONS: 97110-Therapeutic exercises, 97530- Therapeutic activity, 97112- Neuromuscular re-education, 97535- Self Care, 02859- Manual therapy, G0283- Electrical stimulation (unattended), Patient/Family education, Joint mobilization, Spinal mobilization, Cryotherapy, Moist heat, and Biofeedback  PLAN FOR NEXT SESSION: pressure management education, core strength, diaphragmatic breathing, prolapse managment   Josette Mares, PT, DPT12/04/2508:16 AM [facility] [address] [place], [place] [address] Phone # [phone] Fax [phone]

## 2024-01-16 ENCOUNTER — Ambulatory Visit: Attending: Obstetrics and Gynecology

## 2024-01-16 DIAGNOSIS — R278 Other lack of coordination: Secondary | ICD-10-CM | POA: Insufficient documentation

## 2024-01-16 DIAGNOSIS — M6281 Muscle weakness (generalized): Secondary | ICD-10-CM | POA: Diagnosis present

## 2024-01-16 NOTE — Therapy (Signed)
 OUTPATIENT PHYSICAL THERAPY FEMALE PELVIC EVALUATION   Patient Name: Sara Werner MRN: [id number] DOB:[birth year], 64 y.o., female Today's Date: 01/16/2024  END OF SESSION:  PT End of Session - 01/16/24 1019     Visit Number 3    Date for Recertification  06/23/24    Authorization Type BCBS    PT Start Time 1018    PT Stop Time 1056    PT Time Calculation (min) 38 min    Activity Tolerance Patient tolerated treatment well    Behavior During Therapy WFL for tasks assessed/performed            Past Medical History:  Diagnosis Date   Cancer (HCC)    Complication of anesthesia    Family history of breast cancer    Hypertension    PONV (postoperative nausea and vomiting)    Past Surgical History:  Procedure Laterality Date   ANTERIOR CERVICAL DECOMP/DISCECTOMY FUSION N/A 07/24/2021   Procedure: ANTERIOR CERVICAL DECOMPRESSION/DISCECTOMY FUSION CERVICAL FOUR-FIVE, CERVICAL FIVE-SIX, CERVICAL SIX-SEVEN;  Surgeon: Louis Shove, MD;  Location: [facility];  Service: Neurosurgery;  Laterality: N/A;   bladder tack     BREAST BIOPSY Right    negative   HERNIA REPAIR     ROBOTIC ASSISTED TOTAL HYSTERECTOMY WITH BILATERAL SALPINGO OOPHERECTOMY Bilateral 05/08/2018   Procedure: XI ROBOTIC ASSISTED TOTAL HYSTERECTOMY BILATERAL SALPINGO-OOPHORECTOMY,OMENTECTOMY WITH STAGING;  Surgeon: Eloy Herring, MD;  Location: [facility];  Service: Gynecology;  Laterality: Bilateral;   ROTATOR CUFF REPAIR Right    12-19   Patient Active Problem List   Diagnosis Date Noted   Cervical spondylosis with myelopathy and radiculopathy 07/22/2021   Abdominal wall hernia 11/10/2019   Genetic testing 08/04/2018   Family history of breast cancer    Left ovarian epithelial cancer (HCC)    Ovarian mass, left 05/02/2018    PCP: Sophronia Ozell BROCKS, MD  REFERRING PROVIDER: Mat Browning, MD   REFERRING DIAG: N81.9 (ICD-10-CM) - Female genital prolapse, unspecified   THERAPY DIAG:  Muscle weakness  (generalized)  Other lack of coordination  Rationale for Evaluation and Treatment: Rehabilitation  ONSET DATE: 04/2023  SUBJECTIVE:                                                                                                                                                                                           SUBJECTIVE STATEMENT: Pt states that she is doing well. She has been working on exercises.    PERTINENT HISTORY:  Medications for current condition: none Surgeries: Anterior Cervical  Decompression; Robotic Assisted total hysterectomy with bilateral salpingo Oophorectomy;  S/p hernia repair Other: Adenocarcinoma of ovary  Sexual  abuse: No  PAIN:  Are you having pain? No  PRECAUTIONS: Other: cancer  RED FLAGS: None   WEIGHT BEARING RESTRICTIONS: No  FALLS:  Has patient fallen in last 6 months? 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; catering manager, treadmill  PLOF: Independent  PATIENT GOALS: strengthen the pelvic floor   BOWEL MOVEMENT: Pain with bowel movement: No Type of bowel movement:Type (Bristol Stool Scale) Type 4, Frequency daily, and Strain no Fully empty rectum: Yes:   Leakage: No                                                 Bowel urgency: only when eat peppers Pads: No Fiber supplement/laxative No  URINATION: Pain with urination: No Fully empty bladder: Yes:                                           Post-void dribble: No Stream: Strong Urgency: No Frequency:during the day average                                                        Nocturia: No1   Leakage: none Pads/briefs: No  INTERCOURSE:   Ability to have vaginal penetration Yes  Pain with intercourse: none  PREGNANCY: Vaginal deliveries 2 Tearing Yes: a little  PROLAPSE: Bulge   OBJECTIVE:  Note: Objective measures were completed at Evaluation unless otherwise noted.    PATIENT SURVEYS:  PFIQ-7: 33 POPIQ-7 33   COGNITION: Overall  cognitive status: Within functional limits for tasks assessed     SENSATION: Light touch: Appears intact    FUNCTIONAL TESTS:  Single leg stance:  Rt: equal pelvis  Ou:zvljo pelvis Squat:flexed spine  GAIT: Assistive device utilized: None  POSTURE: rounded shoulders, forward head, and decreased lumbar lordosis   LUMBARAROM/PROM:Lumbar ROM is full    LOWER EXTREMITY ROM:  Passive ROM Right eval Left eval  Hip external rotation 40 60   (Blank rows = not tested)  LOWER EXTREMITY MMT:  MMT Right eval Left eval  Hip extension 4/5 4/5  Hip abduction 4/5 3+/5   (Blank rows = not tested) PALPATION: Pelvic Alignment: ASIS are equal  Abdominal:   Diastasis: No Distortion: No  Breathing: breaths more into the upper abdomen Scar tissue: No                 External Perineal Exam: intact and good coloring                             Internal Pelvic Floor: tightness along the sides of the introitus  Patient confirms identification and approves PT to assess internal pelvic floor and treatment Yes All internal or external pelvic floor assessments and/or treatments are completed with proper hand hygiene and gloves hands. If needed gloves are changed with hand hygiene during patient care time. No emotional/communication barriers or cognitive limitation. Patient is motivated to learn. Patient understands and agrees with treatment goals and plan. PT explains patient will be examined in  standing, sitting, and lying down to see how their muscles and joints work. When they are ready, they will be asked to remove their underwear so PT can examine their perineum. The patient is also given the option of providing their own chaperone as one is not provided in our facility. The patient also has the right and is explained the right to defer or refuse any part of the evaluation or treatment including the internal exam. With the patient's consent, PT will use one gloved finger to gently assess  the muscles of the pelvic floor, seeing how well it contracts and relaxes and if there is muscle symmetry. After, the patient will get dressed and PT and patient will discuss exam findings and plan of care. PT and patient discuss plan of care, schedule, attendance policy and HEP activities.   PELVIC MMT:   MMT eval  Vaginal 3/5  (Blank rows = not tested)        TONE: Good tone   TODAY'S TREATMENT:                                                                                                                              DATE:  01/16/24 Neuromuscular re-education: Bridge with hip adduction, transversus abdominus, and pelvic floor muscle 2 x 10 Bridge + hip abduction 2 x 10 Bird dog 12x Bear plank + hip adduction 10x Seated diagonal ball press 10x bil Exercises: Lower trunk rotation 2 x 10 Single knee to chest 5x bil Cat cow 2 x 10 Quadruped fire hydrant 10x bil   01/01/24 Neuromuscular re-education: Transversus abdominus training with multimodal cues for improved motor control and breath coordination Transversus abdominus isometrics in supine 10x with manual cues on bil abdominal wall Supine hip adduction ball press with transversus abdominus and pelvic floor muscle contractions and breath coordination 10x Bridge with hip adduction, transversus abdominus, and pelvic floor muscle 2 x 10 Supine diagonal resisted hip flexion 10x bil Modified dead bug 10x bil Therapeutic activities: Pt education on inverted lying position - perform when she has symptoms of vaginal pressure and/or at the end of the day   12/25/23  EVAL Examination completed, findings reviewed, pt educated on POC, HEP, and female pelvic floor anatomy, reasoning with pelvic floor assessment internally with pt consent. Pt motivated to participate in PT and agreeable to attempt recommendations.     PATIENT EDUCATION:  12/25/23 Education details: Access Code: [email or web address] Person educated: Patient Education method:  Programmer, Multimedia, Demonstration, Actor cues, Verbal cues, and Handouts Education comprehension: verbalized understanding, returned demonstration, verbal cues required, tactile cues required, and needs further education  HOME EXERCISE PROGRAM: 12/25/23 Access Code: [email or web address] URL: [email or web address] Date: 12/25/2023 Prepared by: Channing Pereyra  Exercises - Seated Pelvic Floor Contraction  - 3 x daily - 7 x weekly - 1 sets - 10 reps - 10 sec hold  ASSESSMENT:  CLINICAL IMPRESSION: Patient is a 63 y.o. female who was seen today for physical therapy  treatment for female genital prolapse. Pt doing very well but is having some soreness from decorating for Christmas. She did very well with all exercise progressions today but required cuing for appropriate breathing and pressure management. Patient would benefit from skilled therapy to improve pelvic floor strength and support to reduce strain on pelvic floor and the bladder.   OBJECTIVE IMPAIRMENTS: decreased activity tolerance, decreased coordination, and decreased strength.   ACTIVITY LIMITATIONS: carrying, lifting, standing, squatting, and exercise  PARTICIPATION LIMITATIONS: cleaning and laundry  PERSONAL FACTORS: 3+ comorbidities: Anterior Cervical  Decompression; Robotic Assisted total hysterectomy with bilateral salpingo Oophorectomy;  S/p hernia repair are also affecting patient's functional outcome.   REHAB POTENTIAL: Excellent  CLINICAL DECISION MAKING: Evolving/moderate complexity  EVALUATION COMPLEXITY: Moderate   GOALS: Goals reviewed with patient? Yes  SHORT TERM GOALS: Target date: 01/22/24  Patient educated on pressure management with daily activities to reduce strain on the pelvic floor.  Baseline: Goal status: Met 01/01/24  2.  Patient is able to perform diaphragmatic breathing with expansion of the full abdomen to relax the pelvic floor.  Baseline:  Goal status: MET 01/01/24  3.  Patient understands  how to lift items with keeping the distance between the rib cage and pubic bone to reduce strain on the pelvic floor.  Baseline:  Goal status:  IN PROGRESS 01/16/24   LONG TERM GOALS: Target date: 06/23/24  Patient is independent with advanced HEP for core and pelvic floor strength to reduce the feeling of her bulge 50% of the day.  Baseline:  Goal status: IN PROGRESS 01/16/24  2.  Patient is able to work out with correct body mechanics to not place pressure on the pelvic floor.  Baseline:  Goal status:  IN PROGRESS 01/16/24  3.  Patient is able to reports her pelvic floor strength is increased due to her not feeling the bulge 50% of the time.  Baseline:  Goal status:  IN PROGRESS 01/16/24  4.  Patient understands how to lift items with pelvic floor and core engagement to reduce the feeling of the bulge.  Baseline:  Goal status:  IN PROGRESS 01/16/24  5.  Patient is able to do her rowing machine with reduction of pressure management so she does not feel the pelvic floor bulge.  Baseline:  Goal status:  IN PROGRESS 01/16/24   PLAN:  PT FREQUENCY: 1x/week  PT DURATION: 6 months  PLANNED INTERVENTIONS: 97110-Therapeutic exercises, 97530- Therapeutic activity, 97112- Neuromuscular re-education, 97535- Self Care, 02859- Manual therapy, G0283- Electrical stimulation (unattended), Patient/Family education, Joint mobilization, Spinal mobilization, Cryotherapy, Moist heat, and Biofeedback  PLAN FOR NEXT SESSION: pressure management education, core strength, diaphragmatic breathing, prolapse managment   Josette Mares, PT, DPT12/05/2508:20 AM [facility] [address] [place], [place] [address] Phone # [phone] Fax [phone]

## 2024-01-22 ENCOUNTER — Ambulatory Visit

## 2024-01-22 DIAGNOSIS — M6281 Muscle weakness (generalized): Secondary | ICD-10-CM

## 2024-01-22 DIAGNOSIS — R278 Other lack of coordination: Secondary | ICD-10-CM

## 2024-01-22 NOTE — Therapy (Signed)
 OUTPATIENT PHYSICAL THERAPY FEMALE PELVIC EVALUATION   Patient Name: Sara Werner MRN: [id number] DOB:[birth year], 63 y.o., female Today's Date: 01/22/2024  END OF SESSION:  PT End of Session - 01/22/24 1108     Visit Number 4    Date for Recertification  06/23/24    Authorization Type BCBS    PT Start Time 1107    PT Stop Time 1145    PT Time Calculation (min) 38 min    Activity Tolerance Patient tolerated treatment well    Behavior During Therapy WFL for tasks assessed/performed            Past Medical History:  Diagnosis Date   Cancer (HCC)    Complication of anesthesia    Family history of breast cancer    Hypertension    PONV (postoperative nausea and vomiting)    Past Surgical History:  Procedure Laterality Date   ANTERIOR CERVICAL DECOMP/DISCECTOMY FUSION N/A 07/24/2021   Procedure: ANTERIOR CERVICAL DECOMPRESSION/DISCECTOMY FUSION CERVICAL FOUR-FIVE, CERVICAL FIVE-SIX, CERVICAL SIX-SEVEN;  Surgeon: Louis Shove, MD;  Location: [facility];  Service: Neurosurgery;  Laterality: N/A;   bladder tack     BREAST BIOPSY Right    negative   HERNIA REPAIR     ROBOTIC ASSISTED TOTAL HYSTERECTOMY WITH BILATERAL SALPINGO OOPHERECTOMY Bilateral 05/08/2018   Procedure: XI ROBOTIC ASSISTED TOTAL HYSTERECTOMY BILATERAL SALPINGO-OOPHORECTOMY,OMENTECTOMY WITH STAGING;  Surgeon: Eloy Herring, MD;  Location: [facility];  Service: Gynecology;  Laterality: Bilateral;   ROTATOR CUFF REPAIR Right    12-19   Patient Active Problem List   Diagnosis Date Noted   Cervical spondylosis with myelopathy and radiculopathy 07/22/2021   Abdominal wall hernia 11/10/2019   Genetic testing 08/04/2018   Family history of breast cancer    Left ovarian epithelial cancer (HCC)    Ovarian mass, left 05/02/2018    PCP: Sophronia Ozell BROCKS, MD  REFERRING PROVIDER: Mat Browning, MD   REFERRING DIAG: N81.9 (ICD-10-CM) - Female genital prolapse, unspecified   THERAPY DIAG:  Muscle weakness  (generalized)  Other lack of coordination  Rationale for Evaluation and Treatment: Rehabilitation  ONSET DATE: 04/2023  SUBJECTIVE:                                                                                                                                                                                           SUBJECTIVE STATEMENT: Pt states that she does still feel vaginal pressure, especially after bowel movements.    PERTINENT HISTORY:  Medications for current condition: none Surgeries: Anterior Cervical  Decompression; Robotic Assisted total hysterectomy with bilateral salpingo Oophorectomy;  S/p hernia repair Other: Adenocarcinoma of ovary  Sexual  abuse: No  PAIN:  Are you having pain? No  PRECAUTIONS: Other: cancer  RED FLAGS: None   WEIGHT BEARING RESTRICTIONS: No  FALLS:  Has patient fallen in last 6 months? 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; catering manager, treadmill  PLOF: Independent  PATIENT GOALS: strengthen the pelvic floor   BOWEL MOVEMENT: Pain with bowel movement: No Type of bowel movement:Type (Bristol Stool Scale) Type 4, Frequency daily, and Strain no Fully empty rectum: Yes:   Leakage: No                                                 Bowel urgency: only when eat peppers Pads: No Fiber supplement/laxative No  URINATION: Pain with urination: No Fully empty bladder: Yes:                                           Post-void dribble: No Stream: Strong Urgency: No Frequency:during the day average                                                        Nocturia: No1   Leakage: none Pads/briefs: No  INTERCOURSE:   Ability to have vaginal penetration Yes  Pain with intercourse: none  PREGNANCY: Vaginal deliveries 2 Tearing Yes: a little  PROLAPSE: Bulge   OBJECTIVE:  Note: Objective measures were completed at Evaluation unless otherwise noted.    PATIENT SURVEYS:  PFIQ-7: 33 POPIQ-7  33   COGNITION: Overall cognitive status: Within functional limits for tasks assessed     SENSATION: Light touch: Appears intact    FUNCTIONAL TESTS:  Single leg stance:  Rt: equal pelvis  Ou:zvljo pelvis Squat:flexed spine  GAIT: Assistive device utilized: None  POSTURE: rounded shoulders, forward head, and decreased lumbar lordosis   LUMBARAROM/PROM:Lumbar ROM is full    LOWER EXTREMITY ROM:  Passive ROM Right eval Left eval  Hip external rotation 40 60   (Blank rows = not tested)  LOWER EXTREMITY MMT:  MMT Right eval Left eval  Hip extension 4/5 4/5  Hip abduction 4/5 3+/5   (Blank rows = not tested) PALPATION: Pelvic Alignment: ASIS are equal  Abdominal:   Diastasis: No Distortion: No  Breathing: breaths more into the upper abdomen Scar tissue: No                 External Perineal Exam: intact and good coloring                             Internal Pelvic Floor: tightness along the sides of the introitus  Patient confirms identification and approves PT to assess internal pelvic floor and treatment Yes All internal or external pelvic floor assessments and/or treatments are completed with proper hand hygiene and gloves hands. If needed gloves are changed with hand hygiene during patient care time. No emotional/communication barriers or cognitive limitation. Patient is motivated to learn. Patient understands and agrees with treatment goals and plan. PT explains patient will be examined in  standing, sitting, and lying down to see how their muscles and joints work. When they are ready, they will be asked to remove their underwear so PT can examine their perineum. The patient is also given the option of providing their own chaperone as one is not provided in our facility. The patient also has the right and is explained the right to defer or refuse any part of the evaluation or treatment including the internal exam. With the patient's consent, PT will use one  gloved finger to gently assess the muscles of the pelvic floor, seeing how well it contracts and relaxes and if there is muscle symmetry. After, the patient will get dressed and PT and patient will discuss exam findings and plan of care. PT and patient discuss plan of care, schedule, attendance policy and HEP activities.   PELVIC MMT:   MMT eval  Vaginal 3/5  (Blank rows = not tested)        TONE: Good tone   TODAY'S TREATMENT:                                                                                                                              DATE:  01/22/24 Neuromuscular re-education: Bridge + hip adduction 2 x 10 Bridge + hip abduction + red band 2 x 10 Supine unilateral hip abduction + red band 2 x 10 bil Bridge + heel raises 3 x 10 Bridge + unilateral chest press + 5 lbs 2 x 10 Modified dead bug 2 x 10 bil Supine full shoulder flexion + 5 lbs 2 x 10 Pt education on splinting during bowel movements to help reduce sensation of increased pressure and provide support    01/16/24 Neuromuscular re-education: Bridge with hip adduction, transversus abdominus, and pelvic floor muscle 2 x 10 Bridge + hip abduction 2 x 10 Bird dog 12x Bear plank + hip adduction 10x Seated diagonal ball press 10x bil Exercises: Lower trunk rotation 2 x 10 Single knee to chest 5x bil Cat cow 2 x 10 Quadruped fire hydrant 10x bil   01/01/24 Neuromuscular re-education: Transversus abdominus training with multimodal cues for improved motor control and breath coordination Transversus abdominus isometrics in supine 10x with manual cues on bil abdominal wall Supine hip adduction ball press with transversus abdominus and pelvic floor muscle contractions and breath coordination 10x Bridge with hip adduction, transversus abdominus, and pelvic floor muscle 2 x 10 Supine diagonal resisted hip flexion 10x bil Modified dead bug 10x bil Therapeutic activities: Pt education on inverted lying position  - perform when she has symptoms of vaginal pressure and/or at the end of the day   PATIENT EDUCATION:  12/25/23 Education details: Access Code: [email or web address] Person educated: Patient Education method: Programmer, Multimedia, Facilities Manager, Actor cues, Verbal cues, and Handouts Education comprehension: verbalized understanding, returned demonstration, verbal cues required, tactile cues required, and needs further education  HOME EXERCISE PROGRAM: 12/25/23 Access Code: [email or web address] URL: [email or web address] Date: 12/25/2023 Prepared by: Channing Pereyra  Exercises - Seated Pelvic Floor Contraction  - 3 x daily - 7 x weekly - 1 sets - 10 reps - 10 sec hold  ASSESSMENT:  CLINICAL IMPRESSION: Patient is a 63 y.o. female who was seen today for physical therapy  treatment for female genital prolapse. Pt education performed on splinting to help decrease vaginal pressure with bowel movements and provide increased support during bowel movements. She did well with all strengthening progressions. We focused on supine and bridge activities today to help strengthen in positions that will also help to reduce vaginal pressure. She tolerated all activities well and had no increase in symptoms.  Patient would benefit from skilled therapy to improve pelvic floor strength and support to reduce strain on pelvic floor and the bladder.   OBJECTIVE IMPAIRMENTS: decreased activity tolerance, decreased coordination, and decreased strength.   ACTIVITY LIMITATIONS: carrying, lifting, standing, squatting, and exercise  PARTICIPATION LIMITATIONS: cleaning and laundry  PERSONAL FACTORS: 3+ comorbidities: Anterior Cervical  Decompression; Robotic Assisted total hysterectomy with bilateral salpingo Oophorectomy;  S/p hernia repair are also affecting patient's functional outcome.   REHAB POTENTIAL: Excellent  CLINICAL DECISION MAKING: Evolving/moderate complexity  EVALUATION COMPLEXITY: Moderate   GOALS: Goals  reviewed with patient? Yes  SHORT TERM GOALS: Target date: 01/22/24  Patient educated on pressure management with daily activities to reduce strain on the pelvic floor.  Baseline: Goal status: Met 01/01/24  2.  Patient is able to perform diaphragmatic breathing with expansion of the full abdomen to relax the pelvic floor.  Baseline:  Goal status: MET 01/01/24  3.  Patient understands how to lift items with keeping the distance between the rib cage and pubic bone to reduce strain on the pelvic floor.  Baseline:  Goal status:  IN PROGRESS 01/16/24   LONG TERM GOALS: Target date: 06/23/24  Patient is independent with advanced HEP for core and pelvic floor strength to reduce the feeling of her bulge 50% of the day.  Baseline:  Goal status: IN PROGRESS 01/16/24  2.  Patient is able to work out with correct body mechanics to not place pressure on the pelvic floor.  Baseline:  Goal status:  IN PROGRESS 01/16/24  3.  Patient is able to reports her pelvic floor strength is increased due to her not feeling the bulge 50% of the time.  Baseline:  Goal status:  IN PROGRESS 01/16/24  4.  Patient understands how to lift items with pelvic floor and core engagement to reduce the feeling of the bulge.  Baseline:  Goal status:  IN PROGRESS 01/16/24  5.  Patient is able to do her rowing machine with reduction of pressure management so she does not feel the pelvic floor bulge.  Baseline:  Goal status:  IN PROGRESS 01/16/24   PLAN:  PT FREQUENCY: 1x/week  PT DURATION: 6 months  PLANNED INTERVENTIONS: 97110-Therapeutic exercises, 97530- Therapeutic activity, 97112- Neuromuscular re-education, 97535- Self Care, 02859- Manual therapy, G0283- Electrical stimulation (unattended), Patient/Family education, Joint mobilization, Spinal mobilization, Cryotherapy, Moist heat, and Biofeedback  PLAN FOR NEXT SESSION: pressure management education, core strength, diaphragmatic breathing, prolapse  managment   Josette Mares, PT, DPT12/11/2509:09 AM [facility] [address] [place], [place] [address] Phone # [phone] Fax [phone]

## 2024-01-28 ENCOUNTER — Other Ambulatory Visit (HOSPITAL_COMMUNITY): Payer: Self-pay

## 2024-01-28 MED ORDER — ADDERALL XR 30 MG PO CP24
30.0000 mg | ORAL_CAPSULE | Freq: Every morning | ORAL | 0 refills | Status: AC
  Filled 2024-01-30 – 2024-02-01 (×2): qty 90, 90d supply, fill #0

## 2024-01-29 ENCOUNTER — Ambulatory Visit

## 2024-01-30 ENCOUNTER — Other Ambulatory Visit (HOSPITAL_COMMUNITY): Payer: Self-pay

## 2024-01-31 ENCOUNTER — Other Ambulatory Visit (HOSPITAL_COMMUNITY): Payer: Self-pay

## 2024-02-01 ENCOUNTER — Other Ambulatory Visit (HOSPITAL_COMMUNITY): Payer: Self-pay

## 2024-02-03 ENCOUNTER — Other Ambulatory Visit (HOSPITAL_COMMUNITY): Payer: Self-pay

## 2024-02-17 ENCOUNTER — Ambulatory Visit: Attending: Obstetrics and Gynecology | Admitting: Physical Therapy

## 2024-02-17 ENCOUNTER — Encounter: Payer: Self-pay | Admitting: Physical Therapy

## 2024-02-17 DIAGNOSIS — R278 Other lack of coordination: Secondary | ICD-10-CM | POA: Insufficient documentation

## 2024-02-17 DIAGNOSIS — M6281 Muscle weakness (generalized): Secondary | ICD-10-CM | POA: Diagnosis present

## 2024-02-17 NOTE — Therapy (Signed)
 " OUTPATIENT PHYSICAL THERAPY FEMALE PELVIC EVALUATION   Patient Name: Sara Werner MRN: [id number] DOB:29-Jul-1960, 64 y.o., female Today's Date: 02/17/2024  END OF SESSION:  PT End of Session - 02/17/24 1239     Visit Number 5    Date for Recertification  06/23/24    Authorization Type [employer]    PT Start Time 1235    PT Stop Time 1315    PT Time Calculation (min) 40 min    Activity Tolerance Patient tolerated treatment well    Behavior During Therapy WFL for tasks assessed/performed            Past Medical History:  Diagnosis Date   Cancer (HCC)    Complication of anesthesia    Family history of breast cancer    Hypertension    PONV (postoperative nausea and vomiting)    Past Surgical History:  Procedure Laterality Date   ANTERIOR CERVICAL DECOMP/DISCECTOMY FUSION N/A 07/24/2021   Procedure: ANTERIOR CERVICAL DECOMPRESSION/DISCECTOMY FUSION CERVICAL FOUR-FIVE, CERVICAL FIVE-SIX, CERVICAL SIX-SEVEN;  Surgeon: Louis Shove, MD;  Location: [facility];  Service: Neurosurgery;  Laterality: N/A;   bladder tack     BREAST BIOPSY Right    negative   HERNIA REPAIR     ROBOTIC ASSISTED TOTAL HYSTERECTOMY WITH BILATERAL SALPINGO OOPHERECTOMY Bilateral 05/08/2018   Procedure: XI ROBOTIC ASSISTED TOTAL HYSTERECTOMY BILATERAL SALPINGO-OOPHORECTOMY,OMENTECTOMY WITH STAGING;  Surgeon: Eloy Herring, MD;  Location: [facility];  Service: Gynecology;  Laterality: Bilateral;   ROTATOR CUFF REPAIR Right    12-19   Patient Active Problem List   Diagnosis Date Noted   Cervical spondylosis with myelopathy and radiculopathy 07/22/2021   Abdominal wall hernia 11/10/2019   Genetic testing 08/04/2018   Family history of breast cancer    Left ovarian epithelial cancer (HCC)    Ovarian mass, left 05/02/2018    PCP: Sophronia Ozell BROCKS, MD  REFERRING PROVIDER: Mat Browning, MD   REFERRING DIAG: N81.9 (ICD-10-CM) - Female genital prolapse, unspecified   THERAPY DIAG:  Muscle weakness  (generalized)  Other lack of coordination  Rationale for Evaluation and Treatment: Rehabilitation  ONSET DATE: 04/2023  SUBJECTIVE:                                                                                                                                                                                           SUBJECTIVE STATEMENT: I have been doing a lot of lifting due to putting things away for the holidays.     PERTINENT HISTORY:  Medications for current condition: none Surgeries: Anterior Cervical  Decompression; Robotic Assisted total hysterectomy with bilateral salpingo Oophorectomy;  S/p hernia repair Other:  Adenocarcinoma of ovary  Sexual abuse: No  PAIN:  Are you having pain? No  PRECAUTIONS: Other: cancer  RED FLAGS: None   WEIGHT BEARING RESTRICTIONS: No  FALLS:  Has patient fallen in last 6 months? 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; catering manager, treadmill  PLOF: Independent  PATIENT GOALS: strengthen the pelvic floor   BOWEL MOVEMENT: Pain with bowel movement: No Type of bowel movement:Type (Bristol Stool Scale) Type 4, Frequency daily, and Strain no Fully empty rectum: Yes:   Leakage: No                                                 Bowel urgency: only when eat peppers Pads: No Fiber supplement/laxative No  URINATION: Pain with urination: No Fully empty bladder: Yes:                                           Post-void dribble: No Stream: Strong Urgency: No Frequency:during the day average                                                        Nocturia: No1   Leakage: none Pads/briefs: No  INTERCOURSE:   Ability to have vaginal penetration Yes  Pain with intercourse: none  PREGNANCY: Vaginal deliveries 2 Tearing Yes: a little  PROLAPSE: Bulge   OBJECTIVE:  Note: Objective measures were completed at Evaluation unless otherwise noted.    PATIENT SURVEYS:  PFIQ-7: 33 POPIQ-7  33   COGNITION: Overall cognitive status: Within functional limits for tasks assessed     SENSATION: Light touch: Appears intact    FUNCTIONAL TESTS:  Single leg stance:  Rt: equal pelvis  Ou:zvljo pelvis Squat:flexed spine  GAIT: Assistive device utilized: None  POSTURE: rounded shoulders, forward head, and decreased lumbar lordosis   LUMBARAROM/PROM:Lumbar ROM is full    LOWER EXTREMITY ROM:  Passive ROM Right eval Left eval  Hip external rotation 40 60   (Blank rows = not tested)  LOWER EXTREMITY MMT:  MMT Right eval Left eval  Hip extension 4/5 4/5  Hip abduction 4/5 3+/5   (Blank rows = not tested) PALPATION: Pelvic Alignment: ASIS are equal  Abdominal:   Diastasis: No Distortion: No  Breathing: breaths more into the upper abdomen Scar tissue: No                 External Perineal Exam: intact and good coloring                             Internal Pelvic Floor: tightness along the sides of the introitus  Patient confirms identification and approves PT to assess internal pelvic floor and treatment Yes All internal or external pelvic floor assessments and/or treatments are completed with proper hand hygiene and gloves hands. If needed gloves are changed with hand hygiene during patient care time. No emotional/communication barriers or cognitive limitation. Patient is motivated to learn. Patient understands and agrees with treatment goals and plan. PT explains  patient will be examined in standing, sitting, and lying down to see how their muscles and joints work. When they are ready, they will be asked to remove their underwear so PT can examine their perineum. The patient is also given the option of providing their own chaperone as one is not provided in our facility. The patient also has the right and is explained the right to defer or refuse any part of the evaluation or treatment including the internal exam. With the patient's consent, PT will use one  gloved finger to gently assess the muscles of the pelvic floor, seeing how well it contracts and relaxes and if there is muscle symmetry. After, the patient will get dressed and PT and patient will discuss exam findings and plan of care. PT and patient discuss plan of care, schedule, attendance policy and HEP activities.   PELVIC MMT:   MMT eval  Vaginal 3/5  (Blank rows = not tested)        TONE: Good tone   TODAY'S TREATMENT:  02/17/24 Manual: Soft tissue mobilization: Abdominal massage to promote peristalic motion of the intestines Manual work to the diaphragm Myofascial release: Fascial release of the lower abdomen going through the different restrictions Neuromuscular re-education: Form correction: Squatting with 10 # kettle bell with verbal cues to hinge at the hips and not flex her trunk too far forward Going up and down stairs holding 10 # kettle bell with not reducing the distance between the rib cage and pubic bone Simulate rowing pulling on strap keeping the distance between the rib cage and pubic bone Therapeutic activities: Functional strengthening activities: Squatting with keeping the distance between the rib cage and pubic bone and hinging at the hips.  Discussed with patient not holding her breath with activities to reduce the pressure on the pelvic floor                                                                                                                                DATE:  01/22/24 Neuromuscular re-education: Bridge + hip adduction 2 x 10 Bridge + hip abduction + red band 2 x 10 Supine unilateral hip abduction + red band 2 x 10 bil Bridge + heel raises 3 x 10 Bridge + unilateral chest press + 5 lbs 2 x 10 Modified dead bug 2 x 10 bil Supine full shoulder flexion + 5 lbs 2 x 10 Pt education on splinting during bowel movements to help reduce sensation of increased pressure and provide support    01/16/24 Neuromuscular re-education: Bridge with  hip adduction, transversus abdominus, and pelvic floor muscle 2 x 10 Bridge + hip abduction 2 x 10 Bird dog 12x Bear plank + hip adduction 10x Seated diagonal ball press 10x bil Exercises: Lower trunk rotation 2 x 10 Single knee to chest 5x bil Cat cow 2 x 10 Quadruped fire hydrant 10x bil    PATIENT EDUCATION:  12/25/23 Education details: Access  Code: [email or web address] Person educated: Patient Education method: Explanation, Demonstration, Tactile cues, Verbal cues, and Handouts Education comprehension: verbalized understanding, returned demonstration, verbal cues required, tactile cues required, and needs further education  HOME EXERCISE PROGRAM: 12/25/23 Access Code: [email or web address] URL: [email or web address] Date: 12/25/2023 Prepared by: Channing Pereyra  Exercises - Seated Pelvic Floor Contraction  - 3 x daily - 7 x weekly - 1 sets - 10 reps - 10 sec hold  ASSESSMENT:  CLINICAL IMPRESSION: Patient is a 64 y.o. female who was seen today for physical therapy  treatment for female genital prolapse. Patient reports her bulge feeling is 25% better. She will feel the bulge more with increased lifting and moving boxes.   Patient understands how to keep the distance between the rib cage and pubic bone. She understands abdominal massage to reduce chance of constipation. Patient would benefit from skilled therapy to improve pelvic floor strength and support to reduce strain on pelvic floor and the bladder.   OBJECTIVE IMPAIRMENTS: decreased activity tolerance, decreased coordination, and decreased strength.   ACTIVITY LIMITATIONS: carrying, lifting, standing, squatting, and exercise  PARTICIPATION LIMITATIONS: cleaning and laundry  PERSONAL FACTORS: 3+ comorbidities: Anterior Cervical  Decompression; Robotic Assisted total hysterectomy with bilateral salpingo Oophorectomy;  S/p hernia repair are also affecting patient's functional outcome.   REHAB POTENTIAL: Excellent  CLINICAL  DECISION MAKING: Evolving/moderate complexity  EVALUATION COMPLEXITY: Moderate   GOALS: Goals reviewed with patient? Yes  SHORT TERM GOALS: Target date: 01/22/24  Patient educated on pressure management with daily activities to reduce strain on the pelvic floor.  Baseline: Goal status: Met 01/01/24  2.  Patient is able to perform diaphragmatic breathing with expansion of the full abdomen to relax the pelvic floor.  Baseline:  Goal status: MET 01/01/24  3.  Patient understands how to lift items with keeping the distance between the rib cage and pubic bone to reduce strain on the pelvic floor.  Baseline:  Goal status:  Met 02/17/24   LONG TERM GOALS: Target date: 06/23/24  Patient is independent with advanced HEP for core and pelvic floor strength to reduce the feeling of her bulge 50% of the day.  Baseline:  Goal status: IN PROGRESS 01/16/24  2.  Patient is able to work out with correct body mechanics to not place pressure on the pelvic floor.  Baseline:  Goal status:  IN PROGRESS 01/16/24  3.  Patient is able to reports her pelvic floor strength is increased due to her not feeling the bulge 50% of the time.  Baseline:  Goal status:  IN PROGRESS 01/16/24  4.  Patient understands how to lift items with pelvic floor and core engagement to reduce the feeling of the bulge.  Baseline:  Goal status:  IN PROGRESS 01/16/24  5.  Patient is able to do her rowing machine with reduction of pressure management so she does not feel the pelvic floor bulge.  Baseline:  Goal status:  Met 02/17/23   PLAN:  PT FREQUENCY: 1x/week  PT DURATION: 6 months  PLANNED INTERVENTIONS: 97110-Therapeutic exercises, 97530- Therapeutic activity, 97112- Neuromuscular re-education, 97535- Self Care, [id number]- Manual therapy, [id number]- Electrical stimulation (unattended), Patient/Family education, Joint mobilization, Spinal mobilization, Cryotherapy, Moist heat, and Biofeedback  PLAN FOR NEXT SESSION: pressure  management education with lifting weights and her workout, core strength and pelvic floor in standing, diaphragmatic breathing, prolapse management, check hip strength   Channing Pereyra, PT 02/17/2024 1:55 PM  [facility] [address], [address] [place], [place] [address] Phone # [phone]  Fax [phone]    "

## 2024-02-17 NOTE — Patient Instructions (Signed)
 About Abdominal Massage  Abdominal massage, also called external colon massage, is a self-treatment circular massage technique that can reduce and eliminate gas and ease constipation. The colon naturally contracts in waves in a clockwise direction starting from inside the right hip, moving up toward the ribs, across the belly, and down inside the left hip.  When you perform circular abdominal massage, you help stimulate your colon's normal wave pattern of movement called peristalsis.  It is most beneficial when done after eating.  Positioning You can practice abdominal massage with oil while lying down, or in the shower with soap.  Some people find that it is just as effective to do the massage through clothing while sitting or standing.  How to Massage Start by placing your finger tips or knuckles on your right side, just inside your hip bone.  Make small circular movements while you move upward toward your rib cage.   Once you reach the bottom right side of your rib cage, take your circular movements across to the left side of the bottom of your rib cage.  Next, move downward until you reach the inside of your left hip bone.  This is the path your feces travel in your colon. Continue to perform your abdominal massage in this pattern for 10 minutes each day.     You can apply as much pressure as is comfortable in your massage.  Start gently and build pressure as you continue to practice.  Notice any areas of pain as you massage; areas of slight pain may be relieved as you massage, but if you have areas of significant or intense pain, consult with your healthcare provider.  Other Considerations General physical activity including bending and stretching can have a beneficial massage-like effect on the colon.  Deep breathing can also stimulate the colon because breathing deeply activates the same nervous system that supplies the colon.   Abdominal massage should always be used in combination with a  bowel-conscious diet that is high in the proper type of fiber for you, fluids (primarily water), and a regular exercise program.  [facility] [address] [place], [place] [address] Phone # [phone] Fax [phone]

## 2024-02-24 ENCOUNTER — Ambulatory Visit: Admitting: Physical Therapy

## 2024-02-24 ENCOUNTER — Encounter: Payer: Self-pay | Admitting: Physical Therapy

## 2024-02-24 DIAGNOSIS — R278 Other lack of coordination: Secondary | ICD-10-CM

## 2024-02-24 DIAGNOSIS — M6281 Muscle weakness (generalized): Secondary | ICD-10-CM | POA: Diagnosis not present

## 2024-02-24 NOTE — Therapy (Signed)
 " OUTPATIENT PHYSICAL THERAPY FEMALE PELVIC EVALUATION   Patient Name: Sara Werner MRN: [id number] DOB:1960-09-15, 64 y.o., female Today's Date: 02/24/2024  END OF SESSION:  PT End of Session - 02/24/24 1154     Visit Number 6    Date for Recertification  06/23/24    Authorization Type [employer]    PT Start Time 1154    PT Stop Time 1225    PT Time Calculation (min) 31 min    Activity Tolerance Patient tolerated treatment well    Behavior During Therapy WFL for tasks assessed/performed            Past Medical History:  Diagnosis Date   Cancer (HCC)    Complication of anesthesia    Family history of breast cancer    Hypertension    PONV (postoperative nausea and vomiting)    Past Surgical History:  Procedure Laterality Date   ANTERIOR CERVICAL DECOMP/DISCECTOMY FUSION N/A 07/24/2021   Procedure: ANTERIOR CERVICAL DECOMPRESSION/DISCECTOMY FUSION CERVICAL FOUR-FIVE, CERVICAL FIVE-SIX, CERVICAL SIX-SEVEN;  Surgeon: Louis Shove, MD;  Location: [facility];  Service: Neurosurgery;  Laterality: N/A;   bladder tack     BREAST BIOPSY Right    negative   HERNIA REPAIR     ROBOTIC ASSISTED TOTAL HYSTERECTOMY WITH BILATERAL SALPINGO OOPHERECTOMY Bilateral 05/08/2018   Procedure: XI ROBOTIC ASSISTED TOTAL HYSTERECTOMY BILATERAL SALPINGO-OOPHORECTOMY,OMENTECTOMY WITH STAGING;  Surgeon: Eloy Herring, MD;  Location: [facility];  Service: Gynecology;  Laterality: Bilateral;   ROTATOR CUFF REPAIR Right    12-19   Patient Active Problem List   Diagnosis Date Noted   Cervical spondylosis with myelopathy and radiculopathy 07/22/2021   Abdominal wall hernia 11/10/2019   Genetic testing 08/04/2018   Family history of breast cancer    Left ovarian epithelial cancer (HCC)    Ovarian mass, left 05/02/2018    PCP: Sophronia Ozell BROCKS, MD  REFERRING PROVIDER: Mat Browning, MD   REFERRING DIAG: N81.9 (ICD-10-CM) - Female genital prolapse, unspecified   THERAPY DIAG:  Muscle weakness  (generalized)  Other lack of coordination  Rationale for Evaluation and Treatment: Rehabilitation  ONSET DATE: 04/2023  SUBJECTIVE:                                                                                                                                                                                           SUBJECTIVE STATEMENT: The last visit helped with the pressure.   PERTINENT HISTORY:  Medications for current condition: none Surgeries: Anterior Cervical  Decompression; Robotic Assisted total hysterectomy with bilateral salpingo Oophorectomy;  S/p hernia repair Other: Adenocarcinoma of ovary  Sexual abuse: No  PAIN:  Are  you having pain? No  PRECAUTIONS: Other: cancer  RED FLAGS: None   WEIGHT BEARING RESTRICTIONS: No  FALLS:  Has patient fallen in last 6 months? 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; catering manager, treadmill  PLOF: Independent  PATIENT GOALS: strengthen the pelvic floor   BOWEL MOVEMENT: Pain with bowel movement: No Type of bowel movement:Type (Bristol Stool Scale) Type 4, Frequency daily, and Strain no Fully empty rectum: Yes:   Leakage: No                                                 Bowel urgency: only when eat peppers Pads: No Fiber supplement/laxative No  URINATION: Pain with urination: No Fully empty bladder: Yes:                                           Post-void dribble: No Stream: Strong Urgency: No Frequency:during the day average                                                        Nocturia: No1   Leakage: none Pads/briefs: No  INTERCOURSE:   Ability to have vaginal penetration Yes  Pain with intercourse: none  PREGNANCY: Vaginal deliveries 2 Tearing Yes: a little  PROLAPSE: Bulge   OBJECTIVE:  Note: Objective measures were completed at Evaluation unless otherwise noted.    PATIENT SURVEYS:  PFIQ-7: 33 POPIQ-7 33 02/24/24: PFIQ-7: 24 POPIQ-7  24   COGNITION: Overall cognitive status: Within functional limits for tasks assessed     SENSATION: Light touch: Appears intact    FUNCTIONAL TESTS:  Single leg stance:  Rt: equal pelvis  Ou:zvljo pelvis Squat:flexed spine  GAIT: Assistive device utilized: None  POSTURE: rounded shoulders, forward head, and decreased lumbar lordosis   LUMBARAROM/PROM:Lumbar ROM is full    LOWER EXTREMITY ROM:  Passive ROM Right eval Left eval  Hip external rotation 40 60   (Blank rows = not tested)  LOWER EXTREMITY MMT:  MMT Right eval Left eval Right/left 02/24/24  Hip extension 4/5 4/5 5/5  Hip abduction 4/5 3+/5 4/5   (Blank rows = not tested) PALPATION: Pelvic Alignment: ASIS are equal  Abdominal:   Diastasis: No Distortion: No  Breathing: breaths more into the upper abdomen Scar tissue: No                 External Perineal Exam: intact and good coloring                             Internal Pelvic Floor: tightness along the sides of the introitus  Patient confirms identification and approves PT to assess internal pelvic floor and treatment Yes All internal or external pelvic floor assessments and/or treatments are completed with proper hand hygiene and gloves hands. If needed gloves are changed with hand hygiene during patient care time. No emotional/communication barriers or cognitive limitation. Patient is motivated to learn. Patient understands and agrees with treatment goals and plan. PT explains patient will  be examined in standing, sitting, and lying down to see how their muscles and joints work. When they are ready, they will be asked to remove their underwear so PT can examine their perineum. The patient is also given the option of providing their own chaperone as one is not provided in our facility. The patient also has the right and is explained the right to defer or refuse any part of the evaluation or treatment including the internal exam. With the patient's  consent, PT will use one gloved finger to gently assess the muscles of the pelvic floor, seeing how well it contracts and relaxes and if there is muscle symmetry. After, the patient will get dressed and PT and patient will discuss exam findings and plan of care. PT and patient discuss plan of care, schedule, attendance policy and HEP activities.   PELVIC MMT:   MMT eval  Vaginal 3/5  (Blank rows = not tested)        TONE: Good tone   TODAY'S TREATMENT:  02/24/24 Exercises: Strengthening: Bilateral middle deltoid with 5# wt bilateral with verbal cues to engage the abdomen and extend thoracic Bilateral tricep with 5# Standing lateral sidebend with 5# wt. And using the obliques Standing right rotator cuff strength with green band and verbal cues to engage the core and keep shoulder back Therapeutic activities: Functional strengthening activities: Educated patient on sweeping and vacuuming with using her legs instead of the back and breathing to reduce pressure on the pelvic floor.  Squats with verbal cues to not leg knees past her toes and hinge at hips    02/17/24 Manual: Soft tissue mobilization: Abdominal massage to promote peristalic motion of the intestines Manual work to the diaphragm Myofascial release: Fascial release of the lower abdomen going through the different restrictions Neuromuscular re-education: Form correction: Squatting with 10 # kettle bell with verbal cues to hinge at the hips and not flex her trunk too far forward Going up and down stairs holding 10 # kettle bell with not reducing the distance between the rib cage and pubic bone Simulate rowing pulling on strap keeping the distance between the rib cage and pubic bone Therapeutic activities: Functional strengthening activities: Squatting with keeping the distance between the rib cage and pubic bone and hinging at the hips.  Discussed with patient not holding her breath with activities to reduce the pressure  on the pelvic floor                                                                                                                                DATE:  01/22/24 Neuromuscular re-education: Bridge + hip adduction 2 x 10 Bridge + hip abduction + red band 2 x 10 Supine unilateral hip abduction + red band 2 x 10 bil Bridge + heel raises 3 x 10 Bridge + unilateral chest press + 5 lbs 2 x 10 Modified dead bug 2 x 10 bil Supine full shoulder  flexion + 5 lbs 2 x 10 Pt education on splinting during bowel movements to help reduce sensation of increased pressure and provide support     PATIENT EDUCATION:  12/25/23 Education details: Access Code: [email or web address] Person educated: Patient Education method: Programmer, Multimedia, Demonstration, Actor cues, Verbal cues, and Handouts Education comprehension: verbalized understanding, returned demonstration, verbal cues required, tactile cues required, and needs further education  HOME EXERCISE PROGRAM: 12/25/23 Access Code: [email or web address] URL: [email or web address] Date: 12/25/2023 Prepared by: Channing Pereyra  Exercises - Seated Pelvic Floor Contraction  - 3 x daily - 7 x weekly - 1 sets - 10 reps - 10 sec hold  ASSESSMENT:  CLINICAL IMPRESSION: Patient is a 64 y.o. female who was seen today for physical therapy  treatment for female genital prolapse. Patient is having less pressure in the pelvic region. She understand how to do home tasks and exercise with keeping the distance between the pubic bone and rib cage, Hinging at the hips, breathing and not flexing her spine. She is independent with her HEP.   OBJECTIVE IMPAIRMENTS: decreased activity tolerance, decreased coordination, and decreased strength.   ACTIVITY LIMITATIONS: carrying, lifting, standing, squatting, and exercise  PARTICIPATION LIMITATIONS: cleaning and laundry  PERSONAL FACTORS: 3+ comorbidities: Anterior Cervical  Decompression; Robotic Assisted total hysterectomy with  bilateral salpingo Oophorectomy;  S/p hernia repair are also affecting patient's functional outcome.   REHAB POTENTIAL: Excellent  CLINICAL DECISION MAKING: Evolving/moderate complexity  EVALUATION COMPLEXITY: Moderate   GOALS: Goals reviewed with patient? Yes  SHORT TERM GOALS: Target date: 01/22/24  Patient educated on pressure management with daily activities to reduce strain on the pelvic floor.  Baseline: Goal status: Met 01/01/24  2.  Patient is able to perform diaphragmatic breathing with expansion of the full abdomen to relax the pelvic floor.  Baseline:  Goal status: MET 01/01/24  3.  Patient understands how to lift items with keeping the distance between the rib cage and pubic bone to reduce strain on the pelvic floor.  Baseline:  Goal status:  Met 02/17/24   LONG TERM GOALS: Target date: 06/23/24  Patient is independent with advanced HEP for core and pelvic floor strength to reduce the feeling of her bulge 50% of the day.  Baseline:  Goal status:Met 02/23/14  2.  Patient is able to work out with correct body mechanics to not place pressure on the pelvic floor.  Baseline:  Goal status:  Met 02/24/24  3.  Patient is able to reports her pelvic floor strength is increased due to her not feeling the bulge 50% of the time.  Baseline:  Goal status:  Met 02/24/24  4.  Patient understands how to lift items with pelvic floor and core engagement to reduce the feeling of the bulge.  Baseline:  Goal status:  Met 02/24/24  5.  Patient is able to do her rowing machine with reduction of pressure management so she does not feel the pelvic floor bulge.  Baseline:  Goal status:  Met 02/17/23   PLAN: Discharge to HEP this visit    Channing Pereyra, PT 02/24/2024 12:31 PM  [facility] [address] [place], [place] [address] Phone # [phone] Fax [phone]  PHYSICAL THERAPY DISCHARGE SUMMARY  Visits from Start of Care: 6  Current  functional level related to goals / functional outcomes: See above.    Remaining deficits: See above.    Education / Equipment: HEP   Patient agrees to discharge. Patient goals were met. Patient is being discharged due  to meeting the stated rehab goals. Thank you for the referral.   Channing Pereyra, PT 02/24/2024 12:31 PM      "
